# Patient Record
Sex: Female | Born: 1990 | Race: Asian | Hispanic: No | Marital: Single | Smoking: Current every day smoker
Health system: Southern US, Community
[De-identification: ages and names within clinical notes are randomized; demographics above are authoritative.]

## PROBLEM LIST (undated history)

## (undated) ENCOUNTER — Inpatient Hospital Stay (HOSPITAL_COMMUNITY): Payer: Self-pay

## (undated) DIAGNOSIS — Z8619 Personal history of other infectious and parasitic diseases: Secondary | ICD-10-CM

## (undated) DIAGNOSIS — Z87898 Personal history of other specified conditions: Secondary | ICD-10-CM

## (undated) DIAGNOSIS — R87629 Unspecified abnormal cytological findings in specimens from vagina: Secondary | ICD-10-CM

## (undated) DIAGNOSIS — I10 Essential (primary) hypertension: Secondary | ICD-10-CM

## (undated) DIAGNOSIS — F32A Depression, unspecified: Secondary | ICD-10-CM

## (undated) DIAGNOSIS — F17200 Nicotine dependence, unspecified, uncomplicated: Secondary | ICD-10-CM

## (undated) DIAGNOSIS — Z98891 History of uterine scar from previous surgery: Secondary | ICD-10-CM

## (undated) DIAGNOSIS — F329 Major depressive disorder, single episode, unspecified: Secondary | ICD-10-CM

## (undated) DIAGNOSIS — Z9889 Other specified postprocedural states: Secondary | ICD-10-CM

## (undated) DIAGNOSIS — F1291 Cannabis use, unspecified, in remission: Secondary | ICD-10-CM

## (undated) HISTORY — DX: Unspecified abnormal cytological findings in specimens from vagina: R87.629

## (undated) HISTORY — PX: BRAIN SURGERY: SHX531

---
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
## 2010-08-08 ENCOUNTER — Inpatient Hospital Stay (HOSPITAL_COMMUNITY)
Admission: AD | Admit: 2010-08-08 | Discharge: 2010-08-08 | Payer: Self-pay | Source: Home / Self Care | Admitting: Obstetrics and Gynecology

## 2010-10-14 ENCOUNTER — Inpatient Hospital Stay (HOSPITAL_COMMUNITY)
Admission: AD | Admit: 2010-10-14 | Discharge: 2010-10-14 | Disposition: A | Discharge status: Home / Self Care | Payer: Medicaid Other | Source: Ambulatory Visit | Attending: Obstetrics | Admitting: Obstetrics

## 2010-10-14 DIAGNOSIS — O4700 False labor before 37 completed weeks of gestation, unspecified trimester: Secondary | ICD-10-CM | POA: Insufficient documentation

## 2010-10-14 DIAGNOSIS — O368190 Decreased fetal movements, unspecified trimester, not applicable or unspecified: Secondary | ICD-10-CM | POA: Insufficient documentation

## 2010-10-31 ENCOUNTER — Inpatient Hospital Stay (HOSPITAL_COMMUNITY)
Admission: RE | Admit: 2010-10-31 | Discharge: 2010-11-03 | DRG: 766 | Disposition: A | Discharge status: Home / Self Care | Payer: Medicaid Other | Source: Ambulatory Visit | Attending: Obstetrics | Admitting: Obstetrics

## 2010-10-31 ENCOUNTER — Other Ambulatory Visit: Payer: Self-pay | Admitting: Obstetrics

## 2010-10-31 DIAGNOSIS — O99892 Other specified diseases and conditions complicating childbirth: Secondary | ICD-10-CM | POA: Diagnosis present

## 2010-10-31 DIAGNOSIS — Z22330 Carrier of Group B streptococcus: Secondary | ICD-10-CM

## 2010-10-31 LAB — RPR: RPR Ser Ql: NONREACTIVE

## 2010-10-31 LAB — CBC
HCT: 34.7 % — ABNORMAL LOW (ref 36.0–46.0)
RDW: 15.3 % (ref 11.5–15.5)
WBC: 13.8 10*3/uL — ABNORMAL HIGH (ref 4.0–10.5)

## 2010-11-01 LAB — CBC
HCT: 30.9 % — ABNORMAL LOW (ref 36.0–46.0)
Hemoglobin: 9.9 g/dL — ABNORMAL LOW (ref 12.0–15.0)
MCV: 76.3 fL — ABNORMAL LOW (ref 78.0–100.0)
RBC: 4.05 MIL/uL (ref 3.87–5.11)
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
## 2010-11-07 NOTE — H&P (Signed)
  NAMECHAVON, Jennifer Cohen          ACCOUNT NO.:  [id number]  MEDICAL RECORD NO.:  [id number]           PATIENT TYPE:  I  LOCATION:  9127                          [facility]ACILITY:  [facility]  PHYSICIAN:  Kathreen Cosier, M.D.DATE OF BIRTH:  1991-02-24  DATE OF ADMISSION:  10/31/2010 DATE OF DISCHARGE:                             HISTORY & PHYSICAL   The patient is a 20 year old gravida 2, para 1-0-0-1, EDC November 05, 2010, with a positive GBS, who was admitted in labor.  Cervix 4-5 cm, vertex - 2.  Membranes were ruptured artificially at 11:15 a.m., fluid was clear. The patient was contracting between 2 and 7 minutes apart and about 12:30, she started having prolonged variable decelerations, some with contractions, some without contractions.  Amnioinfusion was begun and the variables did not get better.  She was given 0.25 of terbutaline subcu in preparation for deliver by C-section.  The cervix was unchanged.  She was taken to the operating room for primary low- transverse cesarean section because of nonreassuring fetal heart rate tracing.  PHYSICAL EXAMINATION:  GENERAL:  Revealed a well-developed female, in labor. HEENT:  Negative. LUNGS:  Clear. HEART:  Regular rhythm.  No murmurs, no gallops. BREASTS:  No masses. ABDOMEN:  Term size uterus. PELVIC:  As described above. EXTREMITIES:  Negative.          ______________________________ Kathreen Cosier, M.D.     BAM/MEDQ  D:  10/31/2010  T:  11/01/2010  Job:  [id number]  Electronically Signed by Francoise Ceo M.D. on 11/06/2010 09:43:25 AM

## 2010-11-07 NOTE — Op Note (Signed)
  NAMEJANIJAH, Jennifer Cohen          ACCOUNT NO.:  [id number]  MEDICAL RECORD NO.:  [id number]           PATIENT TYPE:  I  LOCATION:  9127                          [facility]ILITY:  [facility]  PHYSICIAN:  Kathreen Cosier, M.D.DATE OF BIRTH:  [birth year]  DATE OF PROCEDURE:  10/31/2010 DATE OF DISCHARGE:                              OPERATIVE REPORT   PREOPERATIVE DIAGNOSIS:  Nonreassuring fetal heart rate tracing.  POSTOPERATIVE DIAGNOSIS:  Nonreassuring fetal heart rate tracing.  ANESTHESIA:  Spinal.  SURGEON:  Kathreen Cosier, MD  DESCRIPTION OF PROCEDURE:  The patient placed on the operating table in supine position after the spinal administered.  Abdomen prepped and draped.  Bladder emptied with Foley catheter.  Transverse suprapubic incision made, carried down to rectus fascia.  Fascia cleaned and incised to the length of the incision.  Recti muscles retracted laterally.  Peritoneum incised longitudinally.  Transverse incision made in the visceral peritoneum above the bladder.  Bladder mobilized inferiorly.  Transverse lower uterine incision made.  The patient delivered from the LOA position of a female Apgar 8 and 9, weighing 5 pounds 10 ounces.  The team was in attendance.  There were no cord issues noted to the placenta, anterior, removed manually, and sent to Pathology.  Uterine cavity cleaned with dry laps.  Uterine incision closed in 1 layer with continuous suture of #1 chromic.  Hemostasis satisfactory.  Bladder flap reattached with 2-0 chromic.  Uterus well contracted.  Tubes and ovaries normal.  The abdomen closed in layers, peritoneum with continuous suture of O chromic, fascia with continuous suture 0 Dexon, and the skin closed with skin clips.  Blood loss 400 mL. The patient tolerated the procedure well, taken to recovery room in good condition.          ______________________________ Kathreen Cosier, M.D.     BAM/MEDQ  D:  10/31/2010  T:  11/01/2010  Job:   [id number]  Electronically Signed by Francoise Ceo M.D. on 11/06/2010 09:43:28 AM

## 2010-11-12 LAB — CBC
HCT: 36.2 % (ref 36.0–46.0)
MCHC: 32.9 g/dL (ref 30.0–36.0)
MCV: 79.8 fL (ref 78.0–100.0)
RDW: 14.1 % (ref 11.5–15.5)

## 2010-11-12 LAB — URINALYSIS, ROUTINE W REFLEX MICROSCOPIC
Bilirubin Urine: NEGATIVE
Glucose, UA: NEGATIVE mg/dL
Ketones, ur: NEGATIVE mg/dL
Urobilinogen, UA: 0.2 mg/dL (ref 0.0–1.0)
pH: 8 (ref 5.0–8.0)

## 2010-11-12 LAB — URINE CULTURE
Culture  Setup Time: 201112090130
Culture: NO GROWTH

## 2010-11-12 LAB — RAPID URINE DRUG SCREEN, HOSP PERFORMED
Amphetamines: NOT DETECTED
Benzodiazepines: NOT DETECTED
Opiates: NOT DETECTED
Tetrahydrocannabinol: POSITIVE — AB

## 2010-11-12 LAB — DIFFERENTIAL
Basophils Absolute: 0 10*3/uL (ref 0.0–0.1)
Basophils Relative: 0 % (ref 0–1)
Lymphs Abs: 2.1 10*3/uL (ref 0.7–4.0)
Monocytes Relative: 5 % (ref 3–12)

## 2010-11-12 LAB — RPR: RPR Ser Ql: NONREACTIVE

## 2010-11-12 LAB — RUBELLA SCREEN: Rubella: 7 IU/mL — ABNORMAL HIGH

## 2011-09-06 ENCOUNTER — Inpatient Hospital Stay (HOSPITAL_COMMUNITY)
Admission: AD | Admit: 2011-09-06 | Discharge: 2011-09-07 | Disposition: A | Discharge status: Home / Self Care | Payer: Medicaid Other | Source: Ambulatory Visit | Attending: Family Medicine | Admitting: Family Medicine

## 2011-09-06 ENCOUNTER — Encounter (HOSPITAL_COMMUNITY): Payer: Self-pay | Admitting: *Deleted

## 2011-09-06 DIAGNOSIS — O2390 Unspecified genitourinary tract infection in pregnancy, unspecified trimester: Secondary | ICD-10-CM | POA: Insufficient documentation

## 2011-09-06 DIAGNOSIS — R109 Unspecified abdominal pain: Secondary | ICD-10-CM | POA: Insufficient documentation

## 2011-09-06 DIAGNOSIS — N760 Acute vaginitis: Secondary | ICD-10-CM | POA: Insufficient documentation

## 2011-09-06 DIAGNOSIS — B9689 Other specified bacterial agents as the cause of diseases classified elsewhere: Secondary | ICD-10-CM | POA: Insufficient documentation

## 2011-09-06 DIAGNOSIS — A499 Bacterial infection, unspecified: Secondary | ICD-10-CM | POA: Insufficient documentation

## 2011-09-06 DIAGNOSIS — Z331 Pregnant state, incidental: Secondary | ICD-10-CM

## 2011-09-06 LAB — URINALYSIS, ROUTINE W REFLEX MICROSCOPIC
Bilirubin Urine: NEGATIVE
Hgb urine dipstick: NEGATIVE
Leukocytes, UA: NEGATIVE
Nitrite: NEGATIVE
Specific Gravity, Urine: 1.01 (ref 1.005–1.030)

## 2011-09-06 NOTE — Progress Notes (Signed)
Pt states, " I woke up this morning with pain in my right lower abdomen and side. I have a white mousy discharge today."

## 2011-09-07 ENCOUNTER — Encounter (HOSPITAL_COMMUNITY): Payer: Self-pay | Admitting: Family

## 2011-09-07 LAB — WET PREP, GENITAL: Trich, Wet Prep: NONE SEEN

## 2011-09-07 MED ORDER — METRONIDAZOLE 500 MG PO TABS: 500.0000 mg | ORAL_TABLET | Freq: Two times a day (BID) | ORAL | Status: AC

## 2011-09-07 NOTE — ED Provider Notes (Signed)
History     Chief Complaint  Patient presents with  . Abdominal Pain   HPI  Pt reports lower right sided pain that started this morning.  Pain is described as a sharp, crampy pain rated 7/10.  Standing increases the pain.  Denies vaginal bleeding.  Reports positive pregnancy test at home in October.    Past Medical History  Diagnosis Date  . No pertinent past medical history     Past Surgical History  Procedure Date  . Cesarean section     Family History  Problem Relation Age of Onset  . Anesthesia problems Neg Hx   . Hypotension Neg Hx   . Malignant hyperthermia Neg Hx   . Pseudochol deficiency Neg Hx     History  Substance Use Topics  . Smoking status: Current Everyday Smoker -- 0.5 packs/day  . Smokeless tobacco: Never Used  . Alcohol Use: Yes    Allergies: No Known Allergies  No prescriptions prior to admission    Review of Systems  Gastrointestinal: Positive for abdominal pain.  All other systems reviewed and are negative.   Physical Exam   Blood pressure 117/64, pulse 98, temperature 98.4 F (36.9 C), temperature source Oral, resp. rate 20, height 5' (1.524 m), weight 82.611 kg (182 lb 2 oz), last menstrual period 05/31/2011.  Physical Exam  Constitutional: She is oriented to person, place, and time. She appears well-developed and well-nourished.  HENT:  Head: Normocephalic.  Neck: Normal range of motion. Neck supple.  Cardiovascular: Normal rate, regular rhythm and normal heart sounds.   Respiratory: Effort normal and breath sounds normal.  GI: Soft. She exhibits no mass. There is no tenderness. There is no rebound and no guarding.  Genitourinary: No bleeding around the vagina. Vaginal discharge (mucusy) found.       Cervix - closed  Neurological: She is alert and oriented to person, place, and time.  Skin: Skin is warm and dry.  FHTs 150's Fundal Height - 2 below umbilicus  MAU Course  Procedures  Results for orders placed during the  hospital encounter of 09/06/11 (from the past 24 hour(s))  URINALYSIS, ROUTINE W REFLEX MICROSCOPIC     Status: Normal   Collection Time   09/06/11 11:40 PM      Component Value Range   Color, Urine YELLOW  YELLOW    APPearance CLEAR  CLEAR    Specific Gravity, Urine 1.010  1.005 - 1.030    pH 7.5  5.0 - 8.0    Glucose, UA NEGATIVE  NEGATIVE (mg/dL)   Hgb urine dipstick NEGATIVE  NEGATIVE    Bilirubin Urine NEGATIVE  NEGATIVE    Ketones, ur NEGATIVE  NEGATIVE (mg/dL)   Protein, ur NEGATIVE  NEGATIVE (mg/dL)   Urobilinogen, UA 0.2  0.0 - 1.0 (mg/dL)   Nitrite NEGATIVE  NEGATIVE    Leukocytes, UA NEGATIVE  NEGATIVE   WET PREP, GENITAL     Status: Abnormal   Collection Time   09/07/11 12:52 AM      Component Value Range   Yeast, Wet Prep NONE SEEN  NONE SEEN    Trich, Wet Prep NONE SEEN  NONE SEEN    Clue Cells, Wet Prep FEW (*) NONE SEEN    WBC, Wet Prep HPF POC FEW (*) NONE SEEN      Assessment and Plan  Pregnancy Bacterial Vaginosis  Plan: DC home Begin prenatal care RX Flagyl  [facility] 09/07/2011, 12:39 AM

## 2011-09-07 NOTE — ED Provider Notes (Signed)
Chart reviewed and agree with management and plan.  

## 2011-09-08 LAB — POCT PREGNANCY, URINE: Preg Test, Ur: POSITIVE

## 2011-09-08 LAB — GC/CHLAMYDIA PROBE AMP, GENITAL
Chlamydia, DNA Probe: NEGATIVE
GC Probe Amp, Genital: NEGATIVE

## 2012-05-31 ENCOUNTER — Emergency Department (HOSPITAL_COMMUNITY): Payer: Self-pay

## 2012-05-31 ENCOUNTER — Encounter (HOSPITAL_COMMUNITY): Payer: Self-pay | Admitting: Emergency Medicine

## 2012-05-31 ENCOUNTER — Emergency Department (HOSPITAL_COMMUNITY)
Admission: EM | Admit: 2012-05-31 | Discharge: 2012-05-31 | Disposition: A | Discharge status: Home / Self Care | Payer: Self-pay | Attending: Emergency Medicine | Admitting: Emergency Medicine

## 2012-05-31 DIAGNOSIS — M545 Low back pain, unspecified: Secondary | ICD-10-CM | POA: Insufficient documentation

## 2012-05-31 DIAGNOSIS — F17200 Nicotine dependence, unspecified, uncomplicated: Secondary | ICD-10-CM | POA: Insufficient documentation

## 2012-05-31 DIAGNOSIS — Z87828 Personal history of other (healed) physical injury and trauma: Secondary | ICD-10-CM | POA: Insufficient documentation

## 2012-05-31 DIAGNOSIS — M549 Dorsalgia, unspecified: Secondary | ICD-10-CM

## 2012-05-31 MED ORDER — CEPHALEXIN 500 MG PO CAPS: 500.0000 mg | ORAL_CAPSULE | Freq: Four times a day (QID) | ORAL | Status: DC

## 2012-05-31 MED ORDER — OXYCODONE-ACETAMINOPHEN 5-325 MG PO TABS: 1.0000 | ORAL_TABLET | Freq: Four times a day (QID) | ORAL | Status: DC | PRN

## 2012-05-31 MED ORDER — OXYCODONE-ACETAMINOPHEN 5-325 MG PO TABS: 1.0000 | ORAL_TABLET | ORAL | Status: DC | PRN

## 2012-05-31 MED ORDER — OXYCODONE-ACETAMINOPHEN 5-325 MG PO TABS
2.0000 | ORAL_TABLET | Freq: Once | ORAL | Status: AC
  Administered 2012-05-31: 2 via ORAL
  Filled 2012-05-31: qty 2

## 2012-05-31 MED ORDER — CYCLOBENZAPRINE HCL 10 MG PO TABS: 10.0000 mg | ORAL_TABLET | Freq: Two times a day (BID) | ORAL | Status: DC | PRN

## 2012-05-31 MED ORDER — GABAPENTIN 300 MG PO CAPS
300.0000 mg | ORAL_CAPSULE | Freq: Once | ORAL | Status: AC
  Administered 2012-05-31: 300 mg via ORAL
  Filled 2012-05-31 (×2): qty 1

## 2012-05-31 MED ORDER — CYCLOBENZAPRINE HCL 10 MG PO TABS
10.0000 mg | ORAL_TABLET | Freq: Once | ORAL | Status: AC
  Administered 2012-05-31: 10 mg via ORAL
  Filled 2012-05-31: qty 1

## 2012-05-31 NOTE — ED Notes (Signed)
Pt reports having an abortion in Feb and then falling down two steps several days later.  Pt has had lower back pain since then, and left leg also hurts from her calf to lower back.  Pain is constant and is worse when she is in bed at night.  Denies numbness or tingling in extremities.

## 2012-05-31 NOTE — ED Provider Notes (Signed)
History     CSN: [id number]  Arrival date & time 05/31/12  1939   First MD Initiated Contact with Patient 05/31/12 2029      Chief Complaint  Patient presents with  . Back Pain    (Consider location/radiation/quality/duration/timing/severity/associated sxs/prior treatment) HPI Comments: Jennifer Cohen 21 y.o. female   The chief complaint is: Patient presents with:   Back Pain   The patient has medical history significant for:   Past Medical History:   No pertinent past medical history                           Patient presents with back pain since February. Patient states that the pain has been persistent since an abortion she had in February. During that same month she reports slipping on some icy steps and falling on her back. She did not seek medical care after the fall. She rates the pain 8/10 with some radiation to her left leg. Denies fever or chills. Denies NVD or abdominal pain. Denies dysuria, frequency, or urgency. Denies loss of bowel or bladder control.  Denies pregnancy, LMP beginnging of last month.      The history is provided by the patient. No language interpreter was used.    Past Medical History  Diagnosis Date  . No pertinent past medical history     Past Surgical History  Procedure Date  . Cesarean section   . Brain surgery     Family History  Problem Relation Age of Onset  . Anesthesia problems Neg Hx   . Hypotension Neg Hx   . Malignant hyperthermia Neg Hx   . Pseudochol deficiency Neg Hx     History  Substance Use Topics  . Smoking status: Current Every Day Smoker -- 0.5 packs/day  . Smokeless tobacco: Never Used  . Alcohol Use: Yes    OB History    Grav Para Term Preterm Abortions TAB SAB Ect Mult Living   2 1 1       1       Review of Systems  Constitutional: Negative for fever, chills and diaphoresis.  Gastrointestinal: Negative for nausea, vomiting, abdominal pain and diarrhea.  Musculoskeletal: Positive for back  pain.  All other systems reviewed and are negative.    Allergies  Review of patient's allergies indicates no known allergies.  Home Medications  No current outpatient prescriptions on file.  BP 114/66  Pulse 83  Temp 98.1 F (36.7 C) (Oral)  Resp 20  Wt 180 lb (81.647 kg)  SpO2 100%  LMP 05/31/2011  Breastfeeding? Unknown  Physical Exam  Nursing note and vitals reviewed. Constitutional: She appears well-developed and well-nourished. No distress.  HENT:  Head: Normocephalic and atraumatic.  Mouth/Throat: Oropharynx is clear and moist.  Eyes: Conjunctivae normal and EOM are normal. No scleral icterus.  Neck: Normal range of motion. Neck supple.  Cardiovascular: Normal rate, regular rhythm and normal heart sounds.   Pulmonary/Chest: Effort normal and breath sounds normal.  Abdominal: Soft. Bowel sounds are normal. There is no tenderness.  Musculoskeletal: Normal range of motion. She exhibits tenderness. She exhibits no edema.       Tenderness to palpation of the lumbosacral paraspinal muscles.  Neurological: She is alert.  Skin: Skin is warm and dry.    ED Course  Procedures (including critical care time)  Labs Reviewed - No data to display Dg Lumbar Spine Complete  05/31/2012  *RADIOLOGY REPORT*  Clinical Data: Low back  pain.  Larey Seat in February 2013.  LUMBAR SPINE - COMPLETE 4+ VIEW  Comparison: None.  Findings: Five non-rib bearing lumbar vertebrae.  These have normal appearances with no fractures, pars defects or subluxations.  IMPRESSION: Normal examination.   Original Report Authenticated By: Darrol Angel, M.D.      1. Chronic back pain       MDM  Patient presented with complaint of lower back s/p fall in February. Urine pregnancy test: negative. Lumbar spine complete: unremarkable. Patients history and physical seem consistent with sciatica. Patient discharged on pain medication and a muscle relaxer, and instructions to follow-up with primary care. No red  flags for epidural abscess, cauda equina, or pregnancy. Return precautions given verbally and in discharge summary.        Pixie Casino, PA-C 06/01/12 0112

## 2012-06-02 NOTE — ED Provider Notes (Signed)
Medical screening examination/treatment/procedure(s) were performed by non-physician practitioner and as supervising physician I was immediately available for consultation/collaboration.  Jones Skene, M.D.      Jones Skene, MD 06/02/12 1318

## 2013-03-14 ENCOUNTER — Inpatient Hospital Stay (HOSPITAL_COMMUNITY)
Admission: AD | Admit: 2013-03-14 | Discharge: 2013-03-15 | Disposition: A | Discharge status: Home / Self Care | Payer: Self-pay | Source: Ambulatory Visit | Attending: Obstetrics & Gynecology | Admitting: Obstetrics & Gynecology

## 2013-03-14 ENCOUNTER — Encounter (HOSPITAL_COMMUNITY): Payer: Self-pay

## 2013-03-14 ENCOUNTER — Inpatient Hospital Stay (HOSPITAL_COMMUNITY): Payer: Self-pay

## 2013-03-14 DIAGNOSIS — R109 Unspecified abdominal pain: Secondary | ICD-10-CM

## 2013-03-14 DIAGNOSIS — O9989 Other specified diseases and conditions complicating pregnancy, childbirth and the puerperium: Secondary | ICD-10-CM

## 2013-03-14 DIAGNOSIS — O99891 Other specified diseases and conditions complicating pregnancy: Secondary | ICD-10-CM | POA: Insufficient documentation

## 2013-03-14 HISTORY — DX: Depression, unspecified: F32.A

## 2013-03-14 HISTORY — DX: Major depressive disorder, single episode, unspecified: F32.9

## 2013-03-14 LAB — URINALYSIS, ROUTINE W REFLEX MICROSCOPIC
Glucose, UA: NEGATIVE mg/dL
Leukocytes, UA: NEGATIVE
pH: 6 (ref 5.0–8.0)

## 2013-03-14 LAB — CBC
Hemoglobin: 13.1 g/dL (ref 12.0–15.0)
MCHC: 33.9 g/dL (ref 30.0–36.0)
RBC: 5.14 MIL/uL — ABNORMAL HIGH (ref 3.87–5.11)
WBC: 14.1 10*3/uL — ABNORMAL HIGH (ref 4.0–10.5)

## 2013-03-14 LAB — POCT PREGNANCY, URINE: Preg Test, Ur: POSITIVE — AB

## 2013-03-14 LAB — URINE MICROSCOPIC-ADD ON

## 2013-03-14 LAB — HCG, QUANTITATIVE, PREGNANCY: hCG, Beta Chain, Quant, S: 220 m[IU]/mL — ABNORMAL HIGH (ref <5)

## 2013-03-14 MED ORDER — ACETAMINOPHEN 500 MG PO TABS
1000.0000 mg | ORAL_TABLET | Freq: Once | ORAL | Status: AC
  Administered 2013-03-14: 1000 mg via ORAL
  Filled 2013-03-14: qty 2

## 2013-03-14 NOTE — MAU Provider Note (Signed)
History     CSN: [id number]  Arrival date and time: 03/14/13 2118   First Provider Initiated Contact with Patient 03/14/13 2246      Chief Complaint  Patient presents with  . Possible Pregnancy  . Dysmenorrhea   HPI Jennifer Cohen is a 22 y.o. G4P1011 at [redacted]w[redacted]d who presents to MAU today with complaint of lower abdominal cramping since yesterday. She patient states that the pain has been off and on. She has no pain now, but pain earlier today was 5-6/10 at [facility]. She has not taken any pain medication. She has had some nausea without vomiting or diarrhea. She does endorse mild constipation. She denies vaginal bleeding, discharge, fever, dizziness, weakness or fatigue. LMP was 02/05/13    OB History   Grav Para Term Preterm Abortions TAB SAB Ect Mult Living   4 1 1  1 1    1       Past Medical History  Diagnosis Date  . No pertinent past medical history   . Depression     Past Surgical History  Procedure Laterality Date  . Cesarean section    . Brain surgery      Family History  Problem Relation Age of Onset  . Anesthesia problems Neg Hx   . Hypotension Neg Hx   . Malignant hyperthermia Neg Hx   . Pseudochol deficiency Neg Hx     History  Substance Use Topics  . Smoking status: Current Every Day Smoker -- 0.50 packs/day  . Smokeless tobacco: Never Used  . Alcohol Use: Yes    Allergies: No Known Allergies  No prescriptions prior to admission    Review of Systems  Constitutional: Negative for fever and malaise/fatigue.  Gastrointestinal: Positive for nausea and abdominal pain. Negative for vomiting, diarrhea and constipation.  Genitourinary: Negative for dysuria, urgency and frequency.       Neg - vaginal bleeding, discharge  Neurological: Negative for dizziness, loss of consciousness and weakness.   Physical Exam   Blood pressure 113/67, pulse 78, temperature 98.1 F (36.7 C), temperature source Oral, resp. rate 16, height 5' (1.524 m), weight 189 lb  (85.73 kg), last menstrual period 02/05/2013, SpO2 100.00%.  Physical Exam  Constitutional: She is oriented to person, place, and time. She appears well-developed and well-nourished. No distress.  HENT:  Head: Normocephalic and atraumatic.  Cardiovascular: Normal rate, regular rhythm and normal heart sounds.   Respiratory: Effort normal and breath sounds normal. No respiratory distress.  GI: Soft. Bowel sounds are normal. She exhibits no distension and no mass. There is tenderness (mild tenderness to palpation of the RLQ). There is no rebound and no guarding.  Genitourinary: Uterus is not enlarged and not tender. Cervix exhibits friability. Cervix exhibits no motion tenderness and no discharge. Right adnexum displays no mass and no tenderness. Left adnexum displays no mass and no tenderness. No bleeding around the vagina. Vaginal discharge (scant thin white discharge noted) found.  Neurological: She is alert and oriented to person, place, and time.  Skin: Skin is warm and dry. No erythema.  Psychiatric: She has a normal mood and affect.   Results for orders placed during the hospital encounter of 03/14/13 (from the past 24 hour(s))  URINALYSIS, ROUTINE W REFLEX MICROSCOPIC     Status: Abnormal   Collection Time    03/14/13  9:42 PM      Result Value Range   Color, Urine YELLOW  YELLOW   APPearance CLEAR  CLEAR   Specific Gravity, Urine >  1.030 (*) 1.005 - 1.030   pH 6.0  5.0 - 8.0   Glucose, UA NEGATIVE  NEGATIVE mg/dL   Hgb urine dipstick TRACE (*) NEGATIVE   Bilirubin Urine NEGATIVE  NEGATIVE   Ketones, ur NEGATIVE  NEGATIVE mg/dL   Protein, ur NEGATIVE  NEGATIVE mg/dL   Urobilinogen, UA 0.2  0.0 - 1.0 mg/dL   Nitrite NEGATIVE  NEGATIVE   Leukocytes, UA NEGATIVE  NEGATIVE  URINE MICROSCOPIC-ADD ON     Status: Abnormal   Collection Time    03/14/13  9:42 PM      Result Value Range   Squamous Epithelial / LPF FEW (*) RARE   WBC, UA 0-2  <3 WBC/hpf   RBC / HPF 0-2  <3 RBC/hpf    Bacteria, UA FEW (*) RARE   Urine-Other MUCOUS PRESENT    POCT PREGNANCY, URINE     Status: Abnormal   Collection Time    03/14/13 10:06 PM      Result Value Range   Preg Test, Ur POSITIVE (*) NEGATIVE  WET PREP, GENITAL     Status: Abnormal   Collection Time    03/14/13 10:55 PM      Result Value Range   Yeast Wet Prep HPF POC NONE SEEN  NONE SEEN   Trich, Wet Prep NONE SEEN  NONE SEEN   Clue Cells Wet Prep HPF POC NONE SEEN  NONE SEEN   WBC, Wet Prep HPF POC FEW (*) NONE SEEN  CBC     Status: Abnormal   Collection Time    03/14/13 11:05 PM      Result Value Range   WBC 14.1 (*) 4.0 - 10.5 K/uL   RBC 5.14 (*) 3.87 - 5.11 MIL/uL   Hemoglobin 13.1  12.0 - 15.0 g/dL   HCT 16.1  09.6 - 04.5 %   MCV 75.3 (*) 78.0 - 100.0 fL   MCH 25.5 (*) 26.0 - 34.0 pg   MCHC 33.9  30.0 - 36.0 g/dL   RDW 40.9  81.1 - 91.4 %   Platelets 382  150 - 400 K/uL  HCG, QUANTITATIVE, PREGNANCY     Status: Abnormal   Collection Time    03/14/13 11:05 PM      Result Value Range   hCG, Beta Chain, Quant, S 220 (*) <5 mIU/mL   US Ob Comp Less 14 Wks  03/15/2013   *RADIOLOGY REPORT*  Clinical Data: Pelvic pain and cramping.  OBSTETRIC <14 WK [place] AND TRANSVAGINAL OB US  Technique:  Both transabdominal and transvaginal ultrasound examinations were performed for complete evaluation of the gestation as well as the maternal uterus, adnexal regions, and pelvic cul-de-sac.  Transvaginal technique was performed to assess early pregnancy.  Comparison:  None.  Intrauterine gestational sac:  None seen. Yolk sac: N/A Embryo: N/A  Maternal uterus/adnexae: The uterus is unremarkable in appearance.  A small Nabothian cyst is noted at the cervix.  There is a small centrally hypoechoic focus noted arising at the right ovary; its appearance is more suggestive of a corpus luteum, and it does not appear to separate from the right ovary with pressure.  The right ovary is otherwise unremarkable; it measures 3.3 x 2.8 x 3.0 cm.  The left  ovary is within normal limits, measuring 2.4 x 2.2 x 1.6 cm.  No suspicious left adnexal masses are seen.  A small amount of free fluid is noted in the pelvic cul-de-sac.  IMPRESSION: No intrauterine gestational sac yet seen.  A small centrally hypoechoic focus of the right ovary has an appearance more suggestive of a corpus luteum.  Given the patient's quantitative beta HCG level of 220, an ectopic pregnancy would not yet be visible.  Would perform follow-up pelvic ultrasound in 1-2 weeks, to assess for an intrauterine gestational sac.   Original Report Authenticated By: Tonia Ghent, M.D.   US Ob Transvaginal  03/15/2013   *RADIOLOGY REPORT*  Clinical Data: Pelvic pain and cramping.  OBSTETRIC <14 WK [place] AND TRANSVAGINAL OB US  Technique:  Both transabdominal and transvaginal ultrasound examinations were performed for complete evaluation of the gestation as well as the maternal uterus, adnexal regions, and pelvic cul-de-sac.  Transvaginal technique was performed to assess early pregnancy.  Comparison:  None.  Intrauterine gestational sac:  None seen. Yolk sac: N/A Embryo: N/A  Maternal uterus/adnexae: The uterus is unremarkable in appearance.  A small Nabothian cyst is noted at the cervix.  There is a small centrally hypoechoic focus noted arising at the right ovary; its appearance is more suggestive of a corpus luteum, and it does not appear to separate from the right ovary with pressure.  The right ovary is otherwise unremarkable; it measures 3.3 x 2.8 x 3.0 cm.  The left ovary is within normal limits, measuring 2.4 x 2.2 x 1.6 cm.  No suspicious left adnexal masses are seen.  A small amount of free fluid is noted in the pelvic cul-de-sac.  IMPRESSION: No intrauterine gestational sac yet seen.  A small centrally hypoechoic focus of the right ovary has an appearance more suggestive of a corpus luteum.  Given the patient's quantitative beta HCG level of 220, an ectopic pregnancy would not yet be visible.  Would  perform follow-up pelvic ultrasound in 1-2 weeks, to assess for an intrauterine gestational sac.   Original Report Authenticated By: Tonia Ghent, M.D.      MAU Course  Procedures None  MDM +UPT UA, Wet prep, GC/Chlamydia, CBC, quant hCG and [place] today  Assessment and Plan  A: Abdominal pain in pregnancy, first trimester  P: Discharge home Ectopic precautions reviewed Patient advised to take Tylenol PRN for pain Patient advised to return to MAU in ~ 48 hours for repeat quant hCG Patient may return sooner as needed or if symptoms were to change or worsen   Freddi Starr, PA-C  03/15/2013, 1:59 AM

## 2013-03-14 NOTE — MAU Note (Signed)
Pt reports off/on cramping today. Denies dysuria. Denies bleeding. LMP 02/05/2013

## 2013-03-15 LAB — GC/CHLAMYDIA PROBE AMP
CT Probe RNA: NEGATIVE
GC Probe RNA: NEGATIVE

## 2013-03-20 ENCOUNTER — Encounter (HOSPITAL_COMMUNITY): Payer: Self-pay | Admitting: *Deleted

## 2013-03-20 ENCOUNTER — Inpatient Hospital Stay (HOSPITAL_COMMUNITY): Payer: Self-pay

## 2013-03-20 ENCOUNTER — Inpatient Hospital Stay (HOSPITAL_COMMUNITY)
Admission: AD | Admit: 2013-03-20 | Discharge: 2013-03-20 | DRG: 781 | Disposition: A | Discharge status: Home / Self Care | Payer: MEDICAID | Source: Ambulatory Visit | Attending: Obstetrics & Gynecology | Admitting: Obstetrics & Gynecology

## 2013-03-20 DIAGNOSIS — N831 Corpus luteum cyst of ovary, unspecified side: Secondary | ICD-10-CM | POA: Diagnosis present

## 2013-03-20 DIAGNOSIS — N72 Inflammatory disease of cervix uteri: Secondary | ICD-10-CM | POA: Diagnosis present

## 2013-03-20 DIAGNOSIS — O2390 Unspecified genitourinary tract infection in pregnancy, unspecified trimester: Secondary | ICD-10-CM | POA: Diagnosis present

## 2013-03-20 DIAGNOSIS — O26899 Other specified pregnancy related conditions, unspecified trimester: Secondary | ICD-10-CM

## 2013-03-20 DIAGNOSIS — O99891 Other specified diseases and conditions complicating pregnancy: Principal | ICD-10-CM | POA: Diagnosis present

## 2013-03-20 DIAGNOSIS — R109 Unspecified abdominal pain: Secondary | ICD-10-CM | POA: Diagnosis present

## 2013-03-20 DIAGNOSIS — O34599 Maternal care for other abnormalities of gravid uterus, unspecified trimester: Secondary | ICD-10-CM | POA: Diagnosis present

## 2013-03-20 LAB — URINALYSIS, ROUTINE W REFLEX MICROSCOPIC
Ketones, ur: 15 mg/dL — AB
Leukocytes, UA: NEGATIVE
Nitrite: NEGATIVE
Specific Gravity, Urine: 1.025 (ref 1.005–1.030)
pH: 6.5 (ref 5.0–8.0)

## 2013-03-20 LAB — CBC
Hemoglobin: 12.7 g/dL (ref 12.0–15.0)
MCHC: 33.8 g/dL (ref 30.0–36.0)
WBC: 8.8 10*3/uL (ref 4.0–10.5)

## 2013-03-20 LAB — HCG, QUANTITATIVE, PREGNANCY: hCG, Beta Chain, Quant, S: 1772 m[IU]/mL — ABNORMAL HIGH (ref <5)

## 2013-03-20 MED ORDER — PRENATAL VIT W/ FE BISG-FA 27-1 MG PO TABS: 1.0000 | ORAL_TABLET | ORAL | Status: DC

## 2013-03-20 NOTE — MAU Provider Note (Signed)
History     CSN: [id number]  Arrival date and time: 03/20/13 4098   First Provider Initiated Contact with Patient 03/20/13 1844      Chief Complaint  Patient presents with  . Abdominal Pain   HPI  Pt is [redacted]w[redacted]d pregnant initially seen on 7/16 with abd pain and cramping.  Her HCG was 220 and ultrasound showed no IUGS-right CLC.  Pt's cramping and pain has gotten worse since 5 pm this afternoon.  Pt denies vaginal bleeding or spotting, UTI symptoms, constipation or diarrhea. RN note; Pt presents to MAU stating that she had an appointment for an ultrasound on Wednesday July the 16th and was not able to make her appointment. Pt states today she started having abdominal cramping, denies any bleeding.  Past Medical History  Diagnosis Date  . No pertinent past medical history   . Depression     Past Surgical History  Procedure Laterality Date  . Cesarean section    . Brain surgery      Family History  Problem Relation Age of Onset  . Anesthesia problems Neg Hx   . Hypotension Neg Hx   . Malignant hyperthermia Neg Hx   . Pseudochol deficiency Neg Hx     History  Substance Use Topics  . Smoking status: Current Every Day Smoker -- 0.50 packs/day  . Smokeless tobacco: Never Used  . Alcohol Use: Yes    Allergies: No Known Allergies  Prescriptions prior to admission  Medication Sig Dispense Refill  . cyclobenzaprine (FLEXERIL) 10 MG tablet Take 1 tablet (10 mg total) by mouth 2 (two) times daily as needed for muscle spasms.  20 tablet  0  . oxyCODONE-acetaminophen (PERCOCET/ROXICET) 5-325 MG per tablet Take 1 tablet by mouth every 6 (six) hours as needed for pain.  15 tablet  0    Review of Systems  Constitutional: Negative for fever and chills.  Gastrointestinal: Positive for abdominal pain. Negative for diarrhea and constipation.  Genitourinary: Negative for dysuria and urgency.   Physical Exam   Blood pressure 120/41, pulse 99, temperature 96.7 F (35.9 C),  temperature source Oral, resp. rate 16, last menstrual period 02/05/2013.  Physical Exam  Nursing note and vitals reviewed. Constitutional: She is oriented to person, place, and time. She appears well-developed and well-nourished.  HENT:  Head: Normocephalic.  Eyes: Pupils are equal, round, and reactive to light.  Neck: Normal range of motion. Neck supple.  Cardiovascular: Normal rate.   Respiratory: Effort normal.  GI: Soft.  Musculoskeletal: Normal range of motion.  Neurological: She is alert and oriented to person, place, and time.  Skin: Skin is warm and dry.  Psychiatric: She has a normal mood and affect.    MAU Course  Procedures  Care handed over to Deeann Cree, CNM   Assessment and Plan    LINEBERRY,SUSAN 03/20/2013, 6:45 PM     SUBJECTIVE: Mild menstrual-like cramping intermittently over the last week or 2. OBJECTIVE:  Results for orders placed during the hospital encounter of 03/20/13 (from the past 24 hour(s))  URINALYSIS, ROUTINE W REFLEX MICROSCOPIC     Status: Abnormal   Collection Time    03/20/13  6:15 PM      Result Value Range   Color, Urine YELLOW  YELLOW   APPearance CLEAR  CLEAR   Specific Gravity, Urine 1.025  1.005 - 1.030   pH 6.5  5.0 - 8.0   Glucose, UA NEGATIVE  NEGATIVE mg/dL   Hgb urine dipstick NEGATIVE  NEGATIVE  Bilirubin Urine NEGATIVE  NEGATIVE   Ketones, ur 15 (*) NEGATIVE mg/dL   Protein, ur NEGATIVE  NEGATIVE mg/dL   Urobilinogen, UA 0.2  0.0 - 1.0 mg/dL   Nitrite NEGATIVE  NEGATIVE   Leukocytes, UA NEGATIVE  NEGATIVE  CBC     Status: Abnormal   Collection Time    03/20/13  6:45 PM      Result Value Range   WBC 8.8  4.0 - 10.5 K/uL   RBC 4.97  3.87 - 5.11 MIL/uL   Hemoglobin 12.7  12.0 - 15.0 g/dL   HCT 81.1  91.4 - 78.2 %   MCV 75.7 (*) 78.0 - 100.0 fL   MCH 25.6 (*) 26.0 - 34.0 pg   MCHC 33.8  30.0 - 36.0 g/dL   RDW 95.6  21.3 - 08.6 %   Platelets 343  150 - 400 K/uL  HCG, QUANTITATIVE, PREGNANCY     Status:  Abnormal   Collection Time    03/20/13  6:45 PM      Result Value Range   hCG, Beta Chain, Quant, S 1772 (*) <5 mIU/mL   *RADIOLOGY REPORT*  Clinical Data: Pelvic pain and cramping.  OBSTETRIC <14 WK [place] AND TRANSVAGINAL OB US  Technique: Both transabdominal and transvaginal ultrasound  examinations were performed for complete evaluation of the  gestation as well as the maternal uterus, adnexal regions, and  pelvic cul-de-sac. Transvaginal technique was performed to assess  early pregnancy.  Comparison: None.  Intrauterine gestational sac: None seen.  Yolk sac: N/A  Embryo: N/A  Maternal uterus/adnexae:  The uterus is unremarkable in appearance. A small Nabothian cyst  is noted at the cervix.  There is a small centrally hypoechoic focus noted arising at the  right ovary; its appearance is more suggestive of a corpus luteum,  and it does not appear to separate from the right ovary with  pressure. The right ovary is otherwise unremarkable; it measures  3.3 x 2.8 x 3.0 cm.  The left ovary is within normal limits, measuring 2.4 x 2.2 x 1.6  cm. No suspicious left adnexal masses are seen.  A small amount of free fluid is noted in the pelvic cul-de-sac.  IMPRESSION:  No intrauterine gestational sac yet seen. A small centrally  hypoechoic focus of the right ovary has an appearance more  suggestive of a corpus luteum. Given the patient's quantitative  beta HCG level of 220, an ectopic pregnancy would not yet be  visible. Would perform follow-up pelvic ultrasound in 1-2 weeks,  to assess for an intrauterine gestational sac.  Original Report Authenticated By: Tonia Ghent, M.D.   ASSESSMENT: Abdominal pain in early pregnancy Appropriate rise in quant; indeterminate viability  PLAN: Home with ectopic precautions Followup outpatient ultrasound in 2 weeks Rx prenatal vitamins Pregnancy verification letter and eligibility information given

## 2013-03-20 NOTE — MAU Note (Signed)
Pt presents to MAU stating that she had an appointment for an ultrasound on Wednesday July the 16th and was not able to make her appointment. Pt states today she started having abdominal cramping, denies any bleeding.

## 2013-03-20 NOTE — MAU Provider Note (Signed)

## 2013-03-28 ENCOUNTER — Ambulatory Visit (HOSPITAL_COMMUNITY): Payer: Medicaid Other

## 2013-04-04 ENCOUNTER — Ambulatory Visit (HOSPITAL_COMMUNITY): Admission: RE | Admit: 2013-04-04 | Payer: Self-pay | Source: Ambulatory Visit

## 2013-04-15 ENCOUNTER — Inpatient Hospital Stay (HOSPITAL_COMMUNITY): Payer: Self-pay

## 2013-04-15 ENCOUNTER — Inpatient Hospital Stay (HOSPITAL_COMMUNITY)
Admission: AD | Admit: 2013-04-15 | Discharge: 2013-04-16 | Disposition: A | Discharge status: Home / Self Care | Payer: Self-pay | Source: Ambulatory Visit | Attending: Obstetrics & Gynecology | Admitting: Obstetrics & Gynecology

## 2013-04-15 ENCOUNTER — Encounter (HOSPITAL_COMMUNITY): Payer: Self-pay | Admitting: *Deleted

## 2013-04-15 DIAGNOSIS — M549 Dorsalgia, unspecified: Secondary | ICD-10-CM | POA: Insufficient documentation

## 2013-04-15 DIAGNOSIS — O021 Missed abortion: Secondary | ICD-10-CM | POA: Insufficient documentation

## 2013-04-15 DIAGNOSIS — R109 Unspecified abdominal pain: Secondary | ICD-10-CM | POA: Insufficient documentation

## 2013-04-15 LAB — URINALYSIS, ROUTINE W REFLEX MICROSCOPIC
Bilirubin Urine: NEGATIVE
Nitrite: NEGATIVE
Protein, ur: NEGATIVE mg/dL
Urobilinogen, UA: 1 mg/dL (ref 0.0–1.0)

## 2013-04-15 LAB — CBC
MCH: 25.8 pg — ABNORMAL LOW (ref 26.0–34.0)
MCV: 75.4 fL — ABNORMAL LOW (ref 78.0–100.0)
Platelets: 391 10*3/uL (ref 150–400)
RBC: 4.72 MIL/uL (ref 3.87–5.11)
RDW: 14.1 % (ref 11.5–15.5)
WBC: 12.8 10*3/uL — ABNORMAL HIGH (ref 4.0–10.5)

## 2013-04-15 LAB — HCG, QUANTITATIVE, PREGNANCY: hCG, Beta Chain, Quant, S: 4240 m[IU]/mL — ABNORMAL HIGH (ref <5)

## 2013-04-15 NOTE — MAU Note (Signed)
Lower back pain for 2-3 wks. Abdominal cramping since last month. No bleeding or d/c

## 2013-04-15 NOTE — MAU Provider Note (Signed)
History     CSN: [id number]  Arrival date and time: 04/15/13 2013   None     Chief Complaint  Patient presents with  . Back Pain  . Abdominal Cramping   HPI  Jennifer Cohen is a 22 y.o. G3P1011 at [redacted]w[redacted]d who presents today with cramping. She denies any bleeding at this time.   Past Medical History  Diagnosis Date  . No pertinent past medical history   . Depression     Past Surgical History  Procedure Laterality Date  . Cesarean section    . Brain surgery      Family History  Problem Relation Age of Onset  . Anesthesia problems Neg Hx   . Hypotension Neg Hx   . Malignant hyperthermia Neg Hx   . Pseudochol deficiency Neg Hx   . Alcohol abuse Neg Hx     History  Substance Use Topics  . Smoking status: Current Every Day Smoker -- 0.50 packs/day  . Smokeless tobacco: Never Used  . Alcohol Use: No    Allergies: No Known Allergies  Prescriptions prior to admission  Medication Sig Dispense Refill  . acetaminophen (TYLENOL) 500 MG tablet Take 1,000 mg by mouth every 6 (six) hours as needed for pain.      . Prenatal Vit w/ Fe Bisg-FA 27-1 MG TABS Take 1 tablet by mouth 1 day or 1 dose.  30 each  0    ROS Physical Exam   Blood pressure 122/65, pulse 108, temperature 98 F (36.7 C), resp. rate 18, height 5' (1.524 m), weight 86.546 kg (190 lb 12.8 oz), last menstrual period 02/05/2013.  Physical Exam  MAU Course  Procedures  Results for orders placed during the hospital encounter of 04/15/13 (from the past 24 hour(s))  URINALYSIS, ROUTINE W REFLEX MICROSCOPIC     Status: Abnormal   Collection Time    04/15/13  8:26 PM      Result Value Range   Color, Urine YELLOW  YELLOW   APPearance CLEAR  CLEAR   Specific Gravity, Urine 1.025  1.005 - 1.030   pH 6.5  5.0 - 8.0   Glucose, UA NEGATIVE  NEGATIVE mg/dL   Hgb urine dipstick TRACE (*) NEGATIVE   Bilirubin Urine NEGATIVE  NEGATIVE   Ketones, ur 15 (*) NEGATIVE mg/dL   Protein, ur NEGATIVE  NEGATIVE  mg/dL   Urobilinogen, UA 1.0  0.0 - 1.0 mg/dL   Nitrite NEGATIVE  NEGATIVE   Leukocytes, UA NEGATIVE  NEGATIVE  URINE MICROSCOPIC-ADD ON     Status: Abnormal   Collection Time    04/15/13  8:26 PM      Result Value Range   Squamous Epithelial / LPF RARE  RARE   WBC, UA 0-2  <3 WBC/hpf   RBC / HPF 0-2  <3 RBC/hpf   Bacteria, UA FEW (*) RARE  CBC     Status: Abnormal   Collection Time    04/15/13  8:41 PM      Result Value Range   WBC 12.8 (*) 4.0 - 10.5 K/uL   RBC 4.72  3.87 - 5.11 MIL/uL   Hemoglobin 12.2  12.0 - 15.0 g/dL   HCT 40.9 (*) 81.1 - 91.4 %   MCV 75.4 (*) 78.0 - 100.0 fL   MCH 25.8 (*) 26.0 - 34.0 pg   MCHC 34.3  30.0 - 36.0 g/dL   RDW 78.2  95.6 - 21.3 %   Platelets 391  150 - 400 K/uL  HCG,  QUANTITATIVE, PREGNANCY     Status: Abnormal   Collection Time    04/15/13  8:41 PM      Result Value Range   hCG, Beta Chain, Quant, S 4240 (*) <5 mIU/mL   US Ob Transvaginal  04/15/2013   CLINICAL DATA:  Pelvic pain and cramping. Pregnancy with inconclusive viability.  EXAM: TRANSVAGINAL OB ULTRASOUND  TECHNIQUE: Transvaginal ultrasound was performed for complete evaluation of the gestation as well as the maternal uterus, adnexal regions, and pelvic cul-de-sac.  COMPARISON:  03/20/13  FINDINGS: Intrauterine gestational sac: Visualized/ irregular in shape.  Yolk sac:  Not visualized  Embryo:  Not visualized  MSD: 14  mm   6 w   1  d  Maternal uterus/adnexae: Both ovaries are normal in appearance. No mass or free fluid identified.  IMPRESSION: Findings meet definitive criteria for failed pregnancy. This recommendation follows SRU consensus guidelines: Diagnostic Criteria for Nonviable Pregnancy Early in the First Trimester. [facility] [id number].   Electronically Signed   By: Myles Rosenthal   On: 04/15/2013 21:58        Early Intrauterine Pregnancy Failure  X  Documented intrauterine pregnancy failure less than or equal to [redacted] weeks gestation  X  No serious current  illness  x  Baseline Hgb greater than or equal to 10g/dl  x  Patient has easily accessible transportation to the hospital  x  Clear preference  x  Practitioner/physician deems patient reliable  x  Counseling by practitioner or physician  x  Patient education by RN  x  Consent form signed  NA Rho-Gam given by RN if indicated  ___ Medication dispensed   x   Cytotec 800 mcg  x   Intravaginally by patient at home         __   Intravaginally by RN in MAU        __   Rectally by patient at home        __   Rectally by RN in MAU  x Ibuprofen 600 mg 1 tablet by mouth every 6 hours as needed #30  x  Hydrocodone/acetaminophen 5/325 mg by mouth every 4 to 6 hours as needed  x  Phenergan 12.5 mg by mouth every 4 hours as needed for nausea   Assessment and Plan   1. Abortion, missed    Patient desires cytotec at home Instructions given for use Bleeding precautions reviewed Knows when to return to MAU Otherwise FU in GYN clinic in 2 weeks.   Tawnya Crook 04/15/2013, 10:37 PM

## 2013-04-16 DIAGNOSIS — O021 Missed abortion: Secondary | ICD-10-CM

## 2013-04-16 MED ORDER — MISOPROSTOL 200 MCG PO TABS: 800.0000 ug | ORAL_TABLET | Freq: Once | ORAL | Status: DC

## 2013-04-16 MED ORDER — PROMETHAZINE HCL 12.5 MG PO TABS: 12.5000 mg | ORAL_TABLET | ORAL | Status: DC | PRN

## 2013-04-16 MED ORDER — HYDROCODONE-ACETAMINOPHEN 5-325 MG PO TABS: 1.0000 | ORAL_TABLET | Freq: Four times a day (QID) | ORAL | Status: DC | PRN

## 2013-04-16 MED ORDER — IBUPROFEN 200 MG PO TABS: 600.0000 mg | ORAL_TABLET | Freq: Four times a day (QID) | ORAL | Status: DC | PRN

## 2013-04-16 NOTE — MAU Provider Note (Signed)
Attestation of Attending Supervision of Advanced Practitioner (CNM/NP): Evaluation and management procedures were performed by the Advanced Practitioner under my supervision and collaboration.  I have reviewed the Advanced Practitioner's note and chart, and I agree with the management and plan.  HARRAWAY-SMITH, [employer] 3:19 AM

## 2013-04-16 NOTE — Progress Notes (Signed)
Written and verbal d/c instructions given and understanding voiced. Zorita Pang CNM in earlier to discuss d/c plan. Consent signed for cytotec use by patient at home which pt prefers to do.

## 2013-04-28 ENCOUNTER — Encounter: Payer: Medicaid Other | Admitting: Advanced Practice Midwife

## 2013-06-25 ENCOUNTER — Emergency Department (HOSPITAL_COMMUNITY)
Admission: EM | Admit: 2013-06-25 | Discharge: 2013-06-25 | Disposition: A | Discharge status: Home / Self Care | Payer: No Typology Code available for payment source | Attending: Emergency Medicine | Admitting: Emergency Medicine

## 2013-06-25 ENCOUNTER — Encounter (HOSPITAL_COMMUNITY): Payer: Self-pay | Admitting: Emergency Medicine

## 2013-06-25 ENCOUNTER — Emergency Department (HOSPITAL_COMMUNITY): Payer: No Typology Code available for payment source

## 2013-06-25 DIAGNOSIS — F17200 Nicotine dependence, unspecified, uncomplicated: Secondary | ICD-10-CM | POA: Insufficient documentation

## 2013-06-25 DIAGNOSIS — Z3202 Encounter for pregnancy test, result negative: Secondary | ICD-10-CM | POA: Insufficient documentation

## 2013-06-25 DIAGNOSIS — S8010XA Contusion of unspecified lower leg, initial encounter: Secondary | ICD-10-CM | POA: Insufficient documentation

## 2013-06-25 DIAGNOSIS — Z791 Long term (current) use of non-steroidal anti-inflammatories (NSAID): Secondary | ICD-10-CM | POA: Insufficient documentation

## 2013-06-25 DIAGNOSIS — Z8659 Personal history of other mental and behavioral disorders: Secondary | ICD-10-CM | POA: Insufficient documentation

## 2013-06-25 DIAGNOSIS — T148XXA Other injury of unspecified body region, initial encounter: Secondary | ICD-10-CM

## 2013-06-25 DIAGNOSIS — Y9389 Activity, other specified: Secondary | ICD-10-CM | POA: Insufficient documentation

## 2013-06-25 DIAGNOSIS — Y92410 Unspecified street and highway as the place of occurrence of the external cause: Secondary | ICD-10-CM | POA: Insufficient documentation

## 2013-06-25 LAB — RAPID URINE DRUG SCREEN, HOSP PERFORMED
Amphetamines: NOT DETECTED
Benzodiazepines: NOT DETECTED
Cocaine: NOT DETECTED
Opiates: NOT DETECTED
Tetrahydrocannabinol: POSITIVE — AB

## 2013-06-25 LAB — PREGNANCY, URINE: Preg Test, Ur: NEGATIVE

## 2013-06-25 MED ORDER — IBUPROFEN 800 MG PO TABS
800.0000 mg | ORAL_TABLET | Freq: Once | ORAL | Status: AC
  Administered 2013-06-25: 800 mg via ORAL
  Filled 2013-06-25: qty 1

## 2013-06-25 MED ORDER — IBUPROFEN 800 MG PO TABS: 800.0000 mg | ORAL_TABLET | Freq: Three times a day (TID) | ORAL | Status: DC

## 2013-06-25 MED ORDER — ONDANSETRON HCL 4 MG/2ML IJ SOLN
INTRAMUSCULAR | Status: AC
  Filled 2013-06-25: qty 2
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
## 2013-06-25 NOTE — ED Notes (Signed)
Pt is awake, alert.  Pt's respirations are equal and non labored. 

## 2013-06-26 NOTE — ED Provider Notes (Signed)
  Medical screening examination/treatment/procedure(s) were performed by non-physician practitioner and as supervising physician I was immediately available for consultation/collaboration.      Gerhard Munch, MD 06/26/13 [phone]

## 2013-08-20 ENCOUNTER — Encounter (HOSPITAL_COMMUNITY): Payer: Self-pay

## 2013-08-20 ENCOUNTER — Inpatient Hospital Stay (HOSPITAL_COMMUNITY): Payer: Self-pay

## 2013-08-20 ENCOUNTER — Inpatient Hospital Stay (HOSPITAL_COMMUNITY)
Admission: AD | Admit: 2013-08-20 | Discharge: 2013-08-20 | Disposition: A | Discharge status: Home / Self Care | Payer: Medicaid Other | Source: Ambulatory Visit | Attending: Family Medicine | Admitting: Family Medicine

## 2013-08-20 DIAGNOSIS — O99330 Smoking (tobacco) complicating pregnancy, unspecified trimester: Secondary | ICD-10-CM | POA: Insufficient documentation

## 2013-08-20 DIAGNOSIS — O3680X1 Pregnancy with inconclusive fetal viability, fetus 1: Secondary | ICD-10-CM

## 2013-08-20 DIAGNOSIS — R109 Unspecified abdominal pain: Secondary | ICD-10-CM | POA: Insufficient documentation

## 2013-08-20 DIAGNOSIS — O99891 Other specified diseases and conditions complicating pregnancy: Secondary | ICD-10-CM | POA: Insufficient documentation

## 2013-08-20 DIAGNOSIS — O2690 Pregnancy related conditions, unspecified, unspecified trimester: Secondary | ICD-10-CM

## 2013-08-20 DIAGNOSIS — Z3200 Encounter for pregnancy test, result unknown: Secondary | ICD-10-CM

## 2013-08-20 LAB — HCG, QUANTITATIVE, PREGNANCY: hCG, Beta Chain, Quant, S: 11699 m[IU]/mL — ABNORMAL HIGH (ref <5)

## 2013-08-20 LAB — URINE MICROSCOPIC-ADD ON

## 2013-08-20 LAB — URINALYSIS, ROUTINE W REFLEX MICROSCOPIC
Glucose, UA: NEGATIVE mg/dL
Ketones, ur: >80 mg/dL — AB
Protein, ur: NEGATIVE mg/dL

## 2013-08-20 MED ORDER — PRENATAL VITAMINS 0.8 MG PO TABS: 1.0000 | ORAL_TABLET | Freq: Every day | ORAL | Status: DC

## 2013-08-20 NOTE — MAU Provider Note (Signed)
History     CSN: [id number]  Arrival date and time: 08/20/13 1422   None     Chief Complaint  Patient presents with  . Abdominal Pain   HPI Jennifer Cohen is a 22 y.o. G4P1021 at [redacted]w[redacted]d by LMP presents to the MAU for evaluation of pregnancy. Pt had last period end of October. Pt reports painful cramping over the last week worse in the Right lower quadrant. Pt states that she also has had a +home pregnancy test. Pt reporst no vaginal bleeding, no LOF, No fevers, chills, SOB, n/V, d/c. Pt has a hx of similar pegnancy loss with her last pregnancy this spring.    OB History   Grav Para Term Preterm Abortions TAB SAB Ect Mult Living   4 1 1  2 1 1   1       Past Medical History  Diagnosis Date  . No pertinent past medical history   . Depression     Past Surgical History  Procedure Laterality Date  . Cesarean section    . Brain surgery      Family History  Problem Relation Age of Onset  . Anesthesia problems Neg Hx   . Hypotension Neg Hx   . Malignant hyperthermia Neg Hx   . Pseudochol deficiency Neg Hx   . Alcohol abuse Neg Hx     History  Substance Use Topics  . Smoking status: Current Every Day Smoker -- 0.50 packs/day  . Smokeless tobacco: Never Used  . Alcohol Use: No    Allergies: No Known Allergies  No prescriptions prior to admission    ROS as above Physical Exam   Blood pressure 131/71, pulse 105, temperature 97.9 F (36.6 C), temperature source Oral, resp. rate 18, last menstrual period 06/26/2013.  Physical Exam VSS NAD RRR no mgt Moderate tenderness to palpation in RLQ. Neg Percussion.  No c/c/e  Results for Jennifer Cohen (MRN [id number]) as of 08/20/2013 16:24  Ref. Range 08/20/2013 15:00 08/20/2013 15:24 08/20/2013 15:25 08/20/2013 15:32  hCG, Beta Chain, Quant, S Latest Range: <5 mIU/mL  11699 (H)    Preg Test, Ur Latest Range: NEGATIVE     POSITIVE (A)  ABO/RH(D) No range found   A POS   Color, Urine Latest Range: YELLOW   YELLOW     APPearance Latest Range: CLEAR  CLEAR     Specific Gravity, Urine Latest Range: 1.005-1.030  >1.030 (H)     pH Latest Range: 5.0-8.0  6.0     Glucose Latest Range: NEGATIVE mg/dL NEGATIVE     Bilirubin Urine Latest Range: NEGATIVE  SMALL (A)     Ketones, ur Latest Range: NEGATIVE mg/dL >78 (A)     Protein Latest Range: NEGATIVE mg/dL NEGATIVE     Urobilinogen, UA Latest Range: 0.0-1.0 mg/dL 0.2     Nitrite Latest Range: NEGATIVE  NEGATIVE     Leukocytes, UA Latest Range: NEGATIVE  NEGATIVE     Hgb urine dipstick Latest Range: NEGATIVE  SMALL (A)     Urine-Other No range found MUCOUS PRESENT     RBC / HPF Latest Range: <3 RBC/hpf 0-2     Squamous Epithelial / LPF Latest Range: RARE  FEW (A)     Bacteria, UA Latest Range: RARE  RARE      Preliminary [place] report; GA by LMP is 7+6, by [place] 6+1 MAU Course  Procedures  Assessment and Plan  Jennifer Cohen is a 21 y.o. G9F6213 at 6+1 with no evidence of ectopic.  Will discharge home with letter confirmation. Pt may take tylenol PRN for pain. Rx for PNV provided and recommended to establish care with preferred OB. Pt discharged with strict return precautions for worsening sx. Pain, bleeding ect.  Tawana Scale 08/20/2013, 3:32 PM

## 2013-08-20 NOTE — MAU Note (Signed)
Pt presents complaining of cramping a few times a day since last week and one cramp yesterday that is worse with movement. Denies vaginal bleeding and discharge

## 2013-08-21 NOTE — MAU Provider Note (Signed)
Chart reviewed and agree with management and plan.  

## 2013-09-01 NOTE — L&D Delivery Note (Signed)
Delivery Note At 6:24 PM a viable female was delivered via VBAC, Spontaneous ( Compound Presentation: Right Occiput Anterior with the posterior arm).  APGAR: 8, 9t .   Placenta status: Intact, Spontaneous.  Cord: 3 vessels with the following complications: None.  Loose nuchal cord x 1 readily reduced.  Anesthesia: None  Episiotomy: None Lacerations: Labial, sulcal Suture Repair: 2.0 3.0 vicryl rapide Est. Blood Loss (mL):  300 ml  Mom to postpartum.  Baby to Couplet care / Skin to Skin.  JACKSON-MOORE,Una Yeomans A 04/07/2014, 6:49 PM

## 2013-09-07 ENCOUNTER — Inpatient Hospital Stay (HOSPITAL_COMMUNITY)
Admission: AD | Admit: 2013-09-07 | Discharge: 2013-09-07 | Disposition: A | Discharge status: Home / Self Care | Payer: Self-pay | Source: Ambulatory Visit | Attending: Obstetrics and Gynecology | Admitting: Obstetrics and Gynecology

## 2013-09-07 ENCOUNTER — Inpatient Hospital Stay (HOSPITAL_COMMUNITY): Payer: Medicaid Other

## 2013-09-07 ENCOUNTER — Encounter (HOSPITAL_COMMUNITY): Payer: Self-pay | Admitting: *Deleted

## 2013-09-07 DIAGNOSIS — F17200 Nicotine dependence, unspecified, uncomplicated: Secondary | ICD-10-CM | POA: Insufficient documentation

## 2013-09-07 DIAGNOSIS — R109 Unspecified abdominal pain: Secondary | ICD-10-CM | POA: Insufficient documentation

## 2013-09-07 DIAGNOSIS — R197 Diarrhea, unspecified: Secondary | ICD-10-CM | POA: Insufficient documentation

## 2013-09-07 NOTE — MAU Provider Note (Signed)
Attestation of Attending Supervision of Advanced Practitioner (CNM/NP): Evaluation and management procedures were performed by the Advanced Practitioner under my supervision and collaboration.  I have reviewed the Advanced Practitioner's note and chart, and I agree with the management and plan.  Timira Bieda 09/07/2013 7:32 AM

## 2013-09-07 NOTE — MAU Note (Addendum)
Pt states she has had some cold like symptoms for the past 3 days pt states she took tylenol multiple symptom at about 2000 09/06/2013

## 2013-09-07 NOTE — Discharge Instructions (Signed)
Medicines During Pregnancy  During pregnancy, there are medicines that are either safe or unsafe to take. Medicines include prescriptions from your caregiver, over-the-counter medicines, topical creams applied to the skin, and all herbal substances. Medicines are put into either Class A, B, C, or D. Class A and B medicines have been shown to be safe in pregnancy. Class C medicines are also considered to be safe in pregnancy, but these medicines should only be used when necessary. Class D medicines should not be used at all in pregnancy. They can be harmful to a baby.   It is best to take as little medicine as possible while pregnant. However, some medicines are necessary to take for the mother and baby's health. Sometimes, it is more dangerous to stop taking certain medicines than to stay on them. This is often the case for people with long-term (chronic) conditions such as asthma, diabetes, or high blood pressure (hypertension). If you are pregnant and have a chronic illness, call your caregiver right away. Bring a list of your medicines and their doses to your appointments. If you are planning to become pregnant, schedule a doctor's appointment and discuss your medicines with your caregiver.  Lastly, write down the phone number to your pharmacist. They can answer questions regarding a medicine's class and safety. They cannot give advice as to whether you should or should not be on a medicine.   SAFE AND UNSAFE MEDICINES  There is a long list of medicines that are considered safe in pregnancy. Below is a shorter list. For specific medicines, ask your caregiver.   Allergy Medicines  Loratadine, cetirizine, and chlorpheniramine are safe to take. Certain nasal steroid sprays are safe. Talk to your caregiver about specific brands that are safe.  Analgesics  Acetaminophen and acetaminophen with codeine are safe to take. All other nonsteroidal anti-inflammatory drugs (NSAIDS) are not safe. This includes ibuprofen.    Antacids   Many over-the-counter antacids are safe to take. Talk to your caregiver about specific brands that are safe. Famotidine, ranitidine, and lansoprazole are safe. Omepresole is considered safe to take in the second trimester.  Antibiotic Medicines   There are several antibiotics to avoid. These include, but are not limited to, tetracyline, quinolones, and sulfa medications. Talk to your caregiver before taking any antibiotic.   Antihistamines   Talk to your caregiver about specific brands that are safe.   Asthma Medicines   Most asthma steroid inhalers are safe to take. Talk to your caregiver for specific details.  Calcium   Calcium supplements are safe to take. Do not take oyster shell calcium.   Cough and Cold Medicines   It is safe to take products with guaifenesin or dextromethorphan. Talk to your caregiver about specific brands that are safe. It is not safe to take products that contain aspirin or ibuprofen.  Decongestant Medicines  Pseudoephedrine-containing products are safe to take in the second and third trimester.   Depression Medicines   Talk about these medicines with your caregiver.   Antidiarrheal Medicines   It is safe to take loperamide. Talk to your caregiver about specific brands that are safe. It is not safe to take any antidiarrheal medicine that contains bismuth.  Eyedrops   Allergy eyedrops should be limited.   Iron   It is safe to use certain iron-containing medicines for anemia in pregnancy. They require a prescription.   Antinausea Medicines   It is safe to take doxylamine and vitamin B6 as directed. There are other prescription medicines available, if needed.   Sleep aids   It is safe to   take diphenhydramine and acetaminophen with diphenhydramine.   Steroids   Hydrocortisone creams are safe to use as directed. Oral steroids require a prescription. It is not safe to take any hemorrhoid cream with pramoxine or phenylephrine.  Stool softener   It is safe to take stool softener  medicines. Avoid daily or prolonged use of stool softeners.  Thyroid Medicine   It is important to stay on this thyroid medicine. It needs to be followed by your caregiver.   Vaginal Medicines   Your caregiver will prescribe a medicine to you if you have a vaginal infection. Certain antifungal medicines are safe to use if you have a sexually transmitted infection (STI). Talk to your caregiver.   Document Released: 08/18/2005 Document Revised: 11/10/2011 Document Reviewed: 08/19/2011  ExitCare® Patient Information ©2014 [employer].

## 2013-09-07 NOTE — MAU Provider Note (Signed)
  History     CSN: [id number]  Arrival date and time: 09/07/13 0024   None     Chief Complaint  Patient presents with  . Abdominal [id number]  . Diarrhea   Abdominal Cramping Associated symptoms include diarrhea.  Diarrhea     Jennifer Cohen is a 23 y.o. [id number] who presents today with "soft stools" x 3 days. She denies any nausea. Vomiting or fever. She states that she has been taking tylenol multiple symptoms, and is worried that it is not safe to take during pregnancy. She is planning on going to see Dr. Gaynell FaceMarshall for prenatal care. She has not made an appointment.   Past Medical History  Diagnosis Date  . No pertinent past medical history   . Depression     Past Surgical History  Procedure Laterality Date  . Cesarean section    . Brain surgery      Family History  Problem Relation Age of Onset  . Anesthesia problems Neg Hx   . Hypotension Neg Hx   . Malignant hyperthermia Neg Hx   . Pseudochol deficiency Neg Hx   . Alcohol abuse Neg Hx     History  Substance Use Topics  . Smoking status: Current Every Day Smoker -- 0.50 packs/day  . Smokeless tobacco: Never Used  . Alcohol Use: No    Allergies: No Known Allergies  Prescriptions prior to admission  Medication Sig Dispense Refill  . Prenatal Multivit-Min-Fe-FA (PRENATAL VITAMINS) 0.8 MG tablet Take 1 tablet by mouth daily.  90 tablet  3    Review of Systems  Gastrointestinal: Positive for diarrhea.   Physical Exam   Blood pressure 124/66, pulse 94, temperature 98.3 F (36.8 C), temperature source Oral, resp. rate 16, height 5' (1.524 m), weight 86.456 kg (190 lb 9.6 oz), last menstrual period 06/26/2013.  Physical Exam  Nursing note and vitals reviewed. Constitutional: She is oriented to person, place, and time. She appears well-developed and well-nourished. No distress.  Cardiovascular: Normal rate.   Respiratory: Effort normal.  GI: Soft. There is no tenderness.  Neurological: She is alert and  oriented to person, place, and time.  Skin: Skin is warm and dry.  Psychiatric: She has a normal mood and affect.    MAU Course  Procedures    Assessment and Plan   1. Diarrhea in adult patient    BRAT diet First trimester precautions reviewed Return to MAU as needed     Medication List         Prenatal Vitamins 0.8 MG tablet  Take 1 tablet by mouth daily.       Follow-up Information   Schedule an appointment as soon as possible for a visit with Kathreen CosierMARSHALL,BERNARD A, MD.   Specialty:  Obstetrics and Gynecology   Contact information:   [address] [place] [id number] [phone]        Tawnya CrookHogan, Dewayne Severe Donovan 09/07/2013, 1:30 AM

## 2013-09-07 NOTE — MAU Note (Signed)
Pt G4 P1 at 10.3wks with abdominal cramping and diarrhea x 3 today.

## 2013-09-07 NOTE — Progress Notes (Signed)
Pt states she has been having soft stool. For about 3 days

## 2013-10-13 ENCOUNTER — Encounter (HOSPITAL_COMMUNITY): Payer: Self-pay | Admitting: *Deleted

## 2013-10-13 ENCOUNTER — Inpatient Hospital Stay (HOSPITAL_COMMUNITY)
Admission: AD | Admit: 2013-10-13 | Discharge: 2013-10-13 | Disposition: A | Discharge status: Home / Self Care | Payer: Self-pay | Source: Ambulatory Visit | Attending: Obstetrics & Gynecology | Admitting: Obstetrics & Gynecology

## 2013-10-13 DIAGNOSIS — O99330 Smoking (tobacco) complicating pregnancy, unspecified trimester: Secondary | ICD-10-CM | POA: Insufficient documentation

## 2013-10-13 DIAGNOSIS — O26859 Spotting complicating pregnancy, unspecified trimester: Secondary | ICD-10-CM

## 2013-10-13 DIAGNOSIS — O26852 Spotting complicating pregnancy, second trimester: Secondary | ICD-10-CM

## 2013-10-13 DIAGNOSIS — R1032 Left lower quadrant pain: Secondary | ICD-10-CM | POA: Insufficient documentation

## 2013-10-13 LAB — WET PREP, GENITAL
CLUE CELLS WET PREP: NONE SEEN
Trich, Wet Prep: NONE SEEN
Yeast Wet Prep HPF POC: NONE SEEN

## 2013-10-13 LAB — URINALYSIS, ROUTINE W REFLEX MICROSCOPIC
Bilirubin Urine: NEGATIVE
Glucose, UA: 100 mg/dL — AB
KETONES UR: NEGATIVE mg/dL
Leukocytes, UA: NEGATIVE
NITRITE: NEGATIVE
PH: 6 (ref 5.0–8.0)
Protein, ur: NEGATIVE mg/dL
Urobilinogen, UA: 0.2 mg/dL (ref 0.0–1.0)

## 2013-10-13 LAB — URINE MICROSCOPIC-ADD ON

## 2013-10-13 NOTE — MAU Note (Signed)
Pt reports she started having some abd cramping and vg bleeding when she wiped earlier today.

## 2013-10-13 NOTE — Discharge Instructions (Signed)
Vaginal Bleeding During Pregnancy, Second Trimester °A small amount of bleeding (spotting) from the vagina is relatively common in pregnancy. It usually stops on its own. Various things can cause bleeding or spotting in pregnancy. Some bleeding may be related to the pregnancy, and some may not. Sometimes the bleeding is normal and is not a problem. However, bleeding can also be a sign of something serious. Be sure to tell your health care provider about any vaginal bleeding right away. °Some possible causes of vaginal bleeding during the second trimester include: °· Infection, inflammation, or growths on the cervix.   °· The placenta may be partially or completely covering the opening of the cervix inside the uterus (placenta previa). °· The placenta may have separated from the uterus (abruption of the placenta).   °· You may be having early (preterm) labor.   °· The cervix may not be strong enough to keep a baby inside the uterus (cervical insufficiency).   °· Tiny cysts may have developed in the uterus instead of pregnancy tissue (molar pregnancy).  °HOME CARE INSTRUCTIONS  °Watch your condition for any changes. The following actions may help to lessen any discomfort you are feeling: °· Follow your health care provider's instructions for limiting your activity. If your health care provider orders bed rest, you may need to stay in bed and only get up to use the bathroom. However, your health care provider may allow you to continue light activity. °· If needed, make plans for someone to help with your regular activities and responsibilities while you are on bed rest. °· Keep track of the number of pads you use each day, how often you change pads, and how soaked (saturated) they are. Write this down. °· Do not use tampons. Do not douche. °· Do not have sexual intercourse or orgasms until approved by your health care provider. °· If you pass any tissue from your vagina, save the tissue so you can show it to your  health care provider. °· Only take over-the-counter or prescription medicines as directed by your health care provider. °· Do not take aspirin because it can make you bleed. °· Do not exercise or perform any strenuous activities or heavy lifting without your health care provider's permission. °· Keep all follow-up appointments as directed by your health care provider. °SEEK MEDICAL CARE IF: °· You have any vaginal bleeding during any part of your pregnancy. °· You have cramps or labor pains. °SEEK IMMEDIATE MEDICAL CARE IF:  °· You have severe cramps in your back or belly (abdomen). °· You have contractions. °· You have a fever, not controlled by medicine. °· You have chills. °· You pass large clots or tissue from your vagina. °· Your bleeding increases. °· You feel lightheaded or weak, or you have fainting episodes. °· You are leaking fluid or have a gush of fluid from your vagina. °MAKE SURE YOU: °· Understand these instructions. °· Will watch your condition. °· Will get help right away if you are not doing well or get worse. °Document Released: 05/28/2005 Document Revised: 06/08/2013 Document Reviewed: 04/25/2013 °ExitCare® Patient Information ©2014 [employer]. ° °
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
## 2013-10-14 LAB — GC/CHLAMYDIA PROBE AMP
CT Probe RNA: NEGATIVE
GC Probe RNA: NEGATIVE

## 2013-10-14 NOTE — MAU Provider Note (Signed)
Attestation of Attending Supervision of Advanced Practitioner (PA/CNM/NP): Evaluation and management procedures were performed by the Advanced Practitioner under my supervision and collaboration.  I have reviewed the Advanced Practitioner's note and chart, and I agree with the management and plan.  Nhyira Leano Keirsey, MD, FACOG Attending Obstetrician & Gynecologist Faculty Practice, [facility]  

## 2013-11-22 LAB — OB RESULTS CONSOLE HEPATITIS B SURFACE ANTIGEN: Hepatitis B Surface Ag: NEGATIVE

## 2013-11-22 LAB — OB RESULTS CONSOLE HIV ANTIBODY (ROUTINE TESTING): HIV: NONREACTIVE

## 2013-11-22 LAB — OB RESULTS CONSOLE RPR: RPR: NONREACTIVE

## 2013-11-22 LAB — OB RESULTS CONSOLE ABO/RH: RH TYPE: POSITIVE

## 2013-11-22 LAB — OB RESULTS CONSOLE GC/CHLAMYDIA
CHLAMYDIA, DNA PROBE: NEGATIVE
Gonorrhea: NEGATIVE

## 2013-11-22 LAB — OB RESULTS CONSOLE RUBELLA ANTIBODY, IGM: Rubella: NON-IMMUNE/NOT IMMUNE

## 2013-11-22 LAB — OB RESULTS CONSOLE ANTIBODY SCREEN: Antibody Screen: NEGATIVE

## 2014-02-24 ENCOUNTER — Inpatient Hospital Stay (HOSPITAL_COMMUNITY)
Admission: AD | Admit: 2014-02-24 | Discharge: 2014-02-24 | Disposition: A | Discharge status: Home / Self Care | Payer: Medicaid Other | Source: Ambulatory Visit | Attending: Obstetrics | Admitting: Obstetrics

## 2014-02-24 ENCOUNTER — Encounter (HOSPITAL_COMMUNITY): Payer: Self-pay | Admitting: General Practice

## 2014-02-24 DIAGNOSIS — O99330 Smoking (tobacco) complicating pregnancy, unspecified trimester: Secondary | ICD-10-CM | POA: Insufficient documentation

## 2014-02-24 DIAGNOSIS — O26899 Other specified pregnancy related conditions, unspecified trimester: Secondary | ICD-10-CM

## 2014-02-24 DIAGNOSIS — R109 Unspecified abdominal pain: Secondary | ICD-10-CM

## 2014-02-24 DIAGNOSIS — O9989 Other specified diseases and conditions complicating pregnancy, childbirth and the puerperium: Secondary | ICD-10-CM

## 2014-02-24 DIAGNOSIS — O4700 False labor before 37 completed weeks of gestation, unspecified trimester: Secondary | ICD-10-CM | POA: Insufficient documentation

## 2014-02-24 DIAGNOSIS — E860 Dehydration: Secondary | ICD-10-CM

## 2014-02-24 DIAGNOSIS — O99891 Other specified diseases and conditions complicating pregnancy: Secondary | ICD-10-CM | POA: Insufficient documentation

## 2014-02-24 LAB — URINALYSIS, ROUTINE W REFLEX MICROSCOPIC
Bilirubin Urine: NEGATIVE
Glucose, UA: NEGATIVE mg/dL
Ketones, ur: NEGATIVE mg/dL
LEUKOCYTES UA: NEGATIVE
NITRITE: NEGATIVE
Protein, ur: NEGATIVE mg/dL
UROBILINOGEN UA: 0.2 mg/dL (ref 0.0–1.0)
pH: 6 (ref 5.0–8.0)

## 2014-02-24 LAB — URINE MICROSCOPIC-ADD ON

## 2014-02-24 LAB — WET PREP, GENITAL
Clue Cells Wet Prep HPF POC: NONE SEEN
TRICH WET PREP: NONE SEEN
YEAST WET PREP: NONE SEEN
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
## 2014-02-24 NOTE — MAU Note (Signed)
Pt presents to MAU with c/o left side abdominal pain since this am.

## 2014-02-24 NOTE — Discharge Instructions (Signed)
Abdominal Pain During Pregnancy Abdominal pain is common in pregnancy. Most of the time, it does not cause harm. There are many causes of abdominal pain. Some causes are more serious than others. Some of the causes of abdominal pain in pregnancy are easily diagnosed. Occasionally, the diagnosis takes time to understand. Other times, the cause is not determined. Abdominal pain can be a sign that something is very wrong with the pregnancy, or the pain may have nothing to do with the pregnancy at all. For this reason, always tell your health care provider if you have any abdominal discomfort. HOME CARE INSTRUCTIONS  Monitor your abdominal pain for any changes. The following actions may help to alleviate any discomfort you are experiencing:  Do not have sexual intercourse or put anything in your vagina until your symptoms go away completely.  Get plenty of rest until your pain improves.  Drink clear fluids if you feel nauseous. Avoid solid food as long as you are uncomfortable or nauseous.  Only take over-the-counter or prescription medicine as directed by your health care provider.  Keep all follow-up appointments with your health care provider. SEEK IMMEDIATE MEDICAL CARE IF:  You are bleeding, leaking fluid, or passing tissue from the vagina.  You have increasing pain or cramping.  You have persistent vomiting.  You have painful or bloody urination.  You have a fever.  You notice a decrease in your baby's movements.  You have extreme weakness or feel faint.  You have shortness of breath, with or without abdominal pain.  You develop a severe headache with abdominal pain.  You have abnormal vaginal discharge with abdominal pain.  You have persistent diarrhea.  You have abdominal pain that continues even after rest, or gets worse. MAKE SURE YOU:   Understand these instructions.  Will watch your condition.  Will get help right away if you are not doing well or get  worse. Document Released: 08/18/2005 Document Revised: 06/08/2013 Document Reviewed: 03/17/2013 [facility] [address]ExitCare Patient Information 2015 [place]. This information is not intended to replace advice given to you by your health care provider. Make sure you discuss any questions you have with your health care provider. Braxton Hicks Contractions Contractions of the uterus can occur throughout pregnancy. Contractions are not always a sign that you are in labor.  WHAT ARE BRAXTON HICKS CONTRACTIONS?  Contractions that occur before labor are called Braxton Hicks contractions, or false labor. Toward the end of pregnancy (32-34 weeks), these contractions can develop more often and may become more forceful. This is not true labor because these contractions do not result in opening (dilatation) and thinning of the cervix. They are sometimes difficult to tell apart from true labor because these contractions can be forceful and people have different pain tolerances. You should not feel embarrassed if you go to the hospital with false labor. Sometimes, the only way to tell if you are in true labor is for your health care provider to look for changes in the cervix. If there are no prenatal problems or other health problems associated with the pregnancy, it is completely safe to be sent home with false labor and await the onset of true labor. HOW CAN YOU TELL THE DIFFERENCE BETWEEN TRUE AND FALSE LABOR? False Labor  The contractions of false labor are usually shorter and not as hard as those of true labor.   The contractions are usually irregular.   The contractions are often felt in the front of the lower abdomen and in the groin.  groin.   °· The contractions may go away when you walk around or change positions while lying down.   °· The contractions get weaker and are shorter lasting as time goes on.   °· The contractions do not usually become progressively stronger, regular, and closer together as with true labor.    °True Labor °· Contractions in true labor last 30-70 seconds, become very regular, usually become more intense, and increase in frequency.   °· The contractions do not go away with walking.   °· The discomfort is usually felt in the top of the uterus and spreads to the lower abdomen and low back.   °· True labor can be determined by your health care provider with an exam. This will show that the cervix is dilating and getting thinner.   °WHAT TO REMEMBER °· Keep up with your usual exercises and follow other instructions given by your health care provider.   °· Take medicines as directed by your health care provider.   °· Keep your regular prenatal appointments.   °· Eat and drink lightly if you think you are going into labor.   °· If Braxton Hicks contractions are making you uncomfortable:   °¨ Change your position from lying down or resting to walking, or from walking to resting.   °¨ Sit and rest in a tub of warm water.   °¨ Drink 2-3 glasses of water. Dehydration may cause these contractions.   °¨ Do slow and deep breathing several times an hour.   °WHEN SHOULD I SEEK IMMEDIATE MEDICAL CARE? °Seek immediate medical care if: °· Your contractions become stronger, more regular, and closer together.   °· You have fluid leaking or gushing from your vagina.   °· You have a fever.   °· You pass blood-tinged mucus.   °· You have vaginal bleeding.   °· You have continuous abdominal pain.   °· You have low back pain that you never had before.   °· You feel your baby's head pushing down and causing pelvic pressure.   °· Your baby is not moving as much as it used to.   °Document Released: 08/18/2005 Document Revised: 08/23/2013 Document Reviewed: 05/30/2013 °ExitCare® Patient Information ©2015 [employer]. This information is not intended to replace advice given to you by your health care provider. Make sure you discuss any questions you have with your health care provider. ° °

## 2014-03-10 LAB — OB RESULTS CONSOLE GBS: STREP GROUP B AG: POSITIVE

## 2014-04-03 ENCOUNTER — Encounter (HOSPITAL_COMMUNITY): Payer: Self-pay

## 2014-04-03 ENCOUNTER — Inpatient Hospital Stay (HOSPITAL_COMMUNITY)
Admission: AD | Admit: 2014-04-03 | Discharge: 2014-04-03 | Disposition: A | Discharge status: Home / Self Care | Payer: Medicaid Other | Source: Ambulatory Visit | Attending: Obstetrics | Admitting: Obstetrics

## 2014-04-03 DIAGNOSIS — R109 Unspecified abdominal pain: Secondary | ICD-10-CM | POA: Insufficient documentation

## 2014-04-03 DIAGNOSIS — O479 False labor, unspecified: Secondary | ICD-10-CM | POA: Diagnosis not present

## 2014-04-03 MED ORDER — ZOLPIDEM TARTRATE 5 MG PO TABS
5.0000 mg | ORAL_TABLET | Freq: Once | ORAL | Status: AC | PRN
  Administered 2014-04-03: 5 mg via ORAL
  Filled 2014-04-03: qty 1

## 2014-04-03 NOTE — MAU Note (Signed)
Ctx & cramping tonight. Denies LOF or vaginal bleeding. Positive fetal movement. [facility] complications with this pregnancy[facility]

## 2014-04-03 NOTE — MAU Note (Signed)
Pt reports uc's since 0300. Denies LOF or VB.

## 2014-04-03 NOTE — Discharge Instructions (Signed)
Fetal Movement Counts °Patient Name: __________________________________________________ Patient Due Date: ____________________ °Performing a fetal movement count is highly recommended in high-risk pregnancies, but it is good for every pregnant woman to do. Your health care provider may ask you to start counting fetal movements at 28 weeks of the pregnancy. Fetal movements often increase: °· After eating a full meal. °· After physical activity. °· After eating or drinking something sweet or cold. °· At rest. °Pay attention to when you feel the baby is most active. This will help you notice a pattern of your baby's sleep and wake cycles and what factors contribute to an increase in fetal movement. It is important to perform a fetal movement count at the same time each day when your baby is normally most active.  °HOW TO COUNT FETAL MOVEMENTS °1. Find a quiet and comfortable area to sit or lie down on your left side. Lying on your left side provides the best blood and oxygen circulation to your baby. °2. Write down the day and time on a sheet of paper or in a journal. °3. Start counting kicks, flutters, swishes, rolls, or jabs in a 2-hour period. You should feel at least 10 movements within 2 hours. °4. If you do not feel 10 movements in 2 hours, wait 2-3 hours and count again. Look for a change in the pattern or not enough counts in 2 hours. °SEEK MEDICAL CARE IF: °· You feel less than 10 counts in 2 hours, tried twice. °· There is no movement in over an hour. °· The pattern is changing or taking longer each day to reach 10 counts in 2 hours. °· You feel the baby is not moving as he or she usually does. °Date: ____________ Movements: ____________ Start time: ____________ Finish time: ____________  °Date: ____________ Movements: ____________ Start time: ____________ Finish time: ____________ °Date: ____________ Movements: ____________ Start time: ____________ Finish time: ____________ °Date: ____________ Movements:  ____________ Start time: ____________ Finish time: ____________ °Date: ____________ Movements: ____________ Start time: ____________ Finish time: ____________ °Date: ____________ Movements: ____________ Start time: ____________ Finish time: ____________ °Date: ____________ Movements: ____________ Start time: ____________ Finish time: ____________ °Date: ____________ Movements: ____________ Start time: ____________ Finish time: ____________  °Date: ____________ Movements: ____________ Start time: ____________ Finish time: ____________ °Date: ____________ Movements: ____________ Start time: ____________ Finish time: ____________ °Date: ____________ Movements: ____________ Start time: ____________ Finish time: ____________ °Date: ____________ Movements: ____________ Start time: ____________ Finish time: ____________ °Date: ____________ Movements: ____________ Start time: ____________ Finish time: ____________ °Date: ____________ Movements: ____________ Start time: ____________ Finish time: ____________ °Date: ____________ Movements: ____________ Start time: ____________ Finish time: ____________  °Date: ____________ Movements: ____________ Start time: ____________ Finish time: ____________ °Date: ____________ Movements: ____________ Start time: ____________ Finish time: ____________ °Date: ____________ Movements: ____________ Start time: ____________ Finish time: ____________ °Date: ____________ Movements: ____________ Start time: ____________ Finish time: ____________ °Date: ____________ Movements: ____________ Start time: ____________ Finish time: ____________ °Date: ____________ Movements: ____________ Start time: ____________ Finish time: ____________ °Date: ____________ Movements: ____________ Start time: ____________ Finish time: ____________  °Date: ____________ Movements: ____________ Start time: ____________ Finish time: ____________ °Date: ____________ Movements: ____________ Start time: ____________ Finish  time: ____________ °Date: ____________ Movements: ____________ Start time: ____________ Finish time: ____________ °Date: ____________ Movements: ____________ Start time: ____________ Finish time: ____________ °Date: ____________ Movements: ____________ Start time: ____________ Finish time: ____________ °Date: ____________ Movements: ____________ Start time: ____________ Finish time: ____________ °Date: ____________ Movements: ____________ Start time: ____________ Finish time: ____________  °Date: ____________ Movements: ____________ Start time: ____________ Finish   time: ____________ °Date: ____________ Movements: ____________ Start time: ____________ Finish time: ____________ °Date: ____________ Movements: ____________ Start time: ____________ Finish time: ____________ °Date: ____________ Movements: ____________ Start time: ____________ Finish time: ____________ °Date: ____________ Movements: ____________ Start time: ____________ Finish time: ____________ °Date: ____________ Movements: ____________ Start time: ____________ Finish time: ____________ °Date: ____________ Movements: ____________ Start time: ____________ Finish time: ____________  °Date: ____________ Movements: ____________ Start time: ____________ Finish time: ____________ °Date: ____________ Movements: ____________ Start time: ____________ Finish time: ____________ °Date: ____________ Movements: ____________ Start time: ____________ Finish time: ____________ °Date: ____________ Movements: ____________ Start time: ____________ Finish time: ____________ °Date: ____________ Movements: ____________ Start time: ____________ Finish time: ____________ °Date: ____________ Movements: ____________ Start time: ____________ Finish time: ____________ °Date: ____________ Movements: ____________ Start time: ____________ Finish time: ____________  °Date: ____________ Movements: ____________ Start time: ____________ Finish time: ____________ °Date: ____________  Movements: ____________ Start time: ____________ Finish time: ____________ °Date: ____________ Movements: ____________ Start time: ____________ Finish time: ____________ °Date: ____________ Movements: ____________ Start time: ____________ Finish time: ____________ °Date: ____________ Movements: ____________ Start time: ____________ Finish time: ____________ °Date: ____________ Movements: ____________ Start time: ____________ Finish time: ____________ °Date: ____________ Movements: ____________ Start time: ____________ Finish time: ____________  °Date: ____________ Movements: ____________ Start time: ____________ Finish time: ____________ °Date: ____________ Movements: ____________ Start time: ____________ Finish time: ____________ °Date: ____________ Movements: ____________ Start time: ____________ Finish time: ____________ °Date: ____________ Movements: ____________ Start time: ____________ Finish time: ____________ °Date: ____________ Movements: ____________ Start time: ____________ Finish time: ____________ °Date: ____________ Movements: ____________ Start time: ____________ Finish time: ____________ °Document Released: 09/17/2006 Document Revised: 01/02/2014 Document Reviewed: 06/14/2012 °ExitCare® Patient Information ©2015 [employer]. This information is not intended to replace advice given to you by your health care provider. Make sure you discuss any questions you have with your health care provider. °Braxton Hicks Contractions °Contractions of the uterus can occur throughout pregnancy. Contractions are not always a sign that you are in labor.  °WHAT ARE BRAXTON HICKS CONTRACTIONS?  °Contractions that occur before labor are called Braxton Hicks contractions, or false labor. Toward the end of pregnancy (32-34 weeks), these contractions can develop more often and may become more forceful. This is not true labor because these contractions do not result in opening (dilatation) and thinning of the cervix. They  are sometimes difficult to tell apart from true labor because these contractions can be forceful and people have different pain tolerances. You should not feel embarrassed if you go to the hospital with false labor. Sometimes, the only way to tell if you are in true labor is for your health care provider to look for changes in the cervix. °If there are no prenatal problems or other health problems associated with the pregnancy, it is completely safe to be sent home with false labor and await the onset of true labor. °HOW CAN YOU TELL THE DIFFERENCE BETWEEN TRUE AND FALSE LABOR? °False Labor °· The contractions of false labor are usually shorter and not as hard as those of true labor.   °· The contractions are usually irregular.   °· The contractions are often felt in the front of the lower abdomen and in the groin.   °· The contractions may go away when you walk around or change positions while lying down.   °· The contractions get weaker and are shorter lasting as time goes on.   °· The contractions do not usually become progressively stronger, regular, and closer together as with true labor.   °True Labor °· Contractions in true labor last 30-70 seconds, become   very regular, usually become more intense, and increase in frequency.   °· The contractions do not go away with walking.   °· The discomfort is usually felt in the top of the uterus and spreads to the lower abdomen and low back.   °· True labor can be determined by your health care provider with an exam. This will show that the cervix is dilating and getting thinner.   °WHAT TO REMEMBER °· Keep up with your usual exercises and follow other instructions given by your health care provider.   °· Take medicines as directed by your health care provider.   °· Keep your regular prenatal appointments.   °· Eat and drink lightly if you think you are going into labor.   °· If Braxton Hicks contractions are making you uncomfortable:   °¨ Change your position from  lying down or resting to walking, or from walking to resting.   °¨ Sit and rest in a tub of warm water.   °¨ Drink 2-3 glasses of water. Dehydration may cause these contractions.   °¨ Do slow and deep breathing several times an hour.   °WHEN SHOULD I SEEK IMMEDIATE MEDICAL CARE? °Seek immediate medical care if: °· Your contractions become stronger, more regular, and closer together.   °· You have fluid leaking or gushing from your vagina.   °· You have a fever.   °· You pass blood-tinged mucus.   °· You have vaginal bleeding.   °· You have continuous abdominal pain.   °· You have low back pain that you never had before.   °· You feel your baby's head pushing down and causing pelvic pressure.   °· Your baby is not moving as much as it used to.   °Document Released: 08/18/2005 Document Revised: 08/23/2013 Document Reviewed: 05/30/2013 °ExitCare® Patient Information ©2015 [employer]. This information is not intended to replace advice given to you by your health care provider. Make sure you discuss any questions you have with your health care provider. ° °

## 2014-04-06 ENCOUNTER — Encounter (HOSPITAL_COMMUNITY): Payer: Self-pay | Admitting: *Deleted

## 2014-04-06 ENCOUNTER — Inpatient Hospital Stay (HOSPITAL_COMMUNITY)
Admission: AD | Admit: 2014-04-06 | Discharge: 2014-04-06 | Disposition: A | Discharge status: Home / Self Care | Payer: Medicaid Other | Source: Ambulatory Visit | Attending: Obstetrics | Admitting: Obstetrics

## 2014-04-06 DIAGNOSIS — M549 Dorsalgia, unspecified: Secondary | ICD-10-CM | POA: Insufficient documentation

## 2014-04-06 DIAGNOSIS — O99891 Other specified diseases and conditions complicating pregnancy: Secondary | ICD-10-CM | POA: Insufficient documentation

## 2014-04-06 DIAGNOSIS — O9989 Other specified diseases and conditions complicating pregnancy, childbirth and the puerperium: Principal | ICD-10-CM

## 2014-04-06 NOTE — MAU Note (Signed)
Pt. Was taking a shower about an hour ago and noticed mucous discharge. Also, back has been hurting since yesterday. States she is having contractions but has not been timing them. States yesterday back pain was intermittent and today it is dull and constant. Baby has been moving well today. Notes she saw a small amt. Of bright red bleeding on her underwear recently. Denies any other leakage of fluid.

## 2014-04-06 NOTE — Discharge Instructions (Signed)
Braxton Hicks Contractions °Contractions of the uterus can occur throughout pregnancy. Contractions are not always a sign that you are in labor.  °WHAT ARE BRAXTON HICKS CONTRACTIONS?  °Contractions that occur before labor are called Braxton Hicks contractions, or false labor. Toward the end of pregnancy (32-34 weeks), these contractions can develop more often and may become more forceful. This is not true labor because these contractions do not result in opening (dilatation) and thinning of the cervix. They are sometimes difficult to tell apart from true labor because these contractions can be forceful and people have different pain tolerances. You should not feel embarrassed if you go to the hospital with false labor. Sometimes, the only way to tell if you are in true labor is for your health care provider to look for changes in the cervix. °If there are no prenatal problems or other health problems associated with the pregnancy, it is completely safe to be sent home with false labor and await the onset of true labor. °HOW CAN YOU TELL THE DIFFERENCE BETWEEN TRUE AND FALSE LABOR? °False Labor °· The contractions of false labor are usually shorter and not as hard as those of true labor.   °· The contractions are usually irregular.   °· The contractions are often felt in the front of the lower abdomen and in the groin.   °· The contractions may go away when you walk around or change positions while lying down.   °· The contractions get weaker and are shorter lasting as time goes on.   °· The contractions do not usually become progressively stronger, regular, and closer together as with true labor.   °True Labor °· Contractions in true labor last 30-70 seconds, become very regular, usually become more intense, and increase in frequency.   °· The contractions do not go away with walking.   °· The discomfort is usually felt in the top of the uterus and spreads to the lower abdomen and low back.   °· True labor can be  determined by your health care provider with an exam. This will show that the cervix is dilating and getting thinner.   °WHAT TO REMEMBER °· Keep up with your usual exercises and follow other instructions given by your health care provider.   °· Take medicines as directed by your health care provider.   °· Keep your regular prenatal appointments.   °· Eat and drink lightly if you think you are going into labor.   °· If Braxton Hicks contractions are making you uncomfortable:   °¨ Change your position from lying down or resting to walking, or from walking to resting.   °¨ Sit and rest in a tub of warm water.   °¨ Drink 2-3 glasses of water. Dehydration may cause these contractions.   °¨ Do slow and deep breathing several times an hour.   °WHEN SHOULD I SEEK IMMEDIATE MEDICAL CARE? °Seek immediate medical care if: °· Your contractions become stronger, more regular, and closer together.   °· You have fluid leaking or gushing from your vagina.   °· You have a fever.   °· You pass blood-tinged mucus.   °· You have vaginal bleeding.   °· You have continuous abdominal pain.   °· You have low back pain that you never had before.   °· You feel your baby's head pushing down and causing pelvic pressure.   °· Your baby is not moving as much as it used to.   °Document Released: 08/18/2005 Document Revised: 08/23/2013 Document Reviewed: 05/30/2013 °ExitCare® Patient Information ©2015 [employer]. This information is not intended to replace advice given to you by your health care   provider. Make sure you discuss any questions you have with your health care provider. ° °

## 2014-04-07 ENCOUNTER — Encounter (HOSPITAL_COMMUNITY): Payer: Self-pay | Admitting: *Deleted

## 2014-04-07 ENCOUNTER — Inpatient Hospital Stay (HOSPITAL_COMMUNITY)
Admission: AD | Admit: 2014-04-07 | Discharge: 2014-04-09 | DRG: 775 | Disposition: A | Discharge status: Home / Self Care | Payer: Medicaid Other | Source: Ambulatory Visit | Attending: Obstetrics & Gynecology | Admitting: Obstetrics & Gynecology

## 2014-04-07 DIAGNOSIS — F1210 Cannabis abuse, uncomplicated: Secondary | ICD-10-CM | POA: Diagnosis present

## 2014-04-07 DIAGNOSIS — O99892 Other specified diseases and conditions complicating childbirth: Secondary | ICD-10-CM | POA: Diagnosis present

## 2014-04-07 DIAGNOSIS — O99344 Other mental disorders complicating childbirth: Secondary | ICD-10-CM | POA: Diagnosis present

## 2014-04-07 DIAGNOSIS — O479 False labor, unspecified: Secondary | ICD-10-CM | POA: Diagnosis present

## 2014-04-07 DIAGNOSIS — O34219 Maternal care for unspecified type scar from previous cesarean delivery: Secondary | ICD-10-CM | POA: Diagnosis present

## 2014-04-07 DIAGNOSIS — F3289 Other specified depressive episodes: Secondary | ICD-10-CM | POA: Diagnosis present

## 2014-04-07 DIAGNOSIS — F329 Major depressive disorder, single episode, unspecified: Secondary | ICD-10-CM | POA: Diagnosis present

## 2014-04-07 DIAGNOSIS — Z22330 Carrier of Group B streptococcus: Secondary | ICD-10-CM

## 2014-04-07 DIAGNOSIS — O9989 Other specified diseases and conditions complicating pregnancy, childbirth and the puerperium: Secondary | ICD-10-CM

## 2014-04-07 DIAGNOSIS — O328XX0 Maternal care for other malpresentation of fetus, not applicable or unspecified: Secondary | ICD-10-CM | POA: Diagnosis present

## 2014-04-07 LAB — CBC
HEMATOCRIT: 35.5 % — AB (ref 36.0–46.0)
HEMOGLOBIN: 12.1 g/dL (ref 12.0–15.0)
MCH: 25.8 pg — ABNORMAL LOW (ref 26.0–34.0)
MCHC: 34.1 g/dL (ref 30.0–36.0)
MCV: 75.7 fL — AB (ref 78.0–100.0)
Platelets: 363 10*3/uL (ref 150–400)
RBC: 4.69 MIL/uL (ref 3.87–5.11)
RDW: 15 % (ref 11.5–15.5)
WBC: 12.9 10*3/uL — ABNORMAL HIGH (ref 4.0–10.5)

## 2014-04-07 LAB — TYPE AND SCREEN
ABO/RH(D): A POS
ANTIBODY SCREEN: NEGATIVE

## 2014-04-07 MED ORDER — ONDANSETRON HCL 4 MG/2ML IJ SOLN: 4.0000 mg | INTRAMUSCULAR | Status: DC | PRN

## 2014-04-07 MED ORDER — OXYCODONE-ACETAMINOPHEN 5-325 MG PO TABS
1.0000 | ORAL_TABLET | ORAL | Status: DC | PRN
  Administered 2014-04-07: 1 via ORAL
  Filled 2014-04-07: qty 1

## 2014-04-07 MED ORDER — MEASLES, MUMPS & RUBELLA VAC ~~LOC~~ INJ
0.5000 mL | INJECTION | Freq: Once | SUBCUTANEOUS | Status: AC
  Administered 2014-04-09: 0.5 mL via SUBCUTANEOUS
  Filled 2014-04-07: qty 0.5

## 2014-04-07 MED ORDER — BENZOCAINE-MENTHOL 20-0.5 % EX AERO
1.0000 "application " | INHALATION_SPRAY | CUTANEOUS | Status: DC | PRN
  Administered 2014-04-08: 1 via TOPICAL
  Filled 2014-04-07: qty 56

## 2014-04-07 MED ORDER — MAGNESIUM HYDROXIDE 400 MG/5ML PO SUSP: 30.0000 mL | ORAL | Status: DC | PRN

## 2014-04-07 MED ORDER — TERBUTALINE SULFATE 1 MG/ML IJ SOLN: 0.2500 mg | Freq: Once | INTRAMUSCULAR | Status: DC | PRN

## 2014-04-07 MED ORDER — ZOLPIDEM TARTRATE 5 MG PO TABS: 5.0000 mg | ORAL_TABLET | Freq: Every evening | ORAL | Status: DC | PRN

## 2014-04-07 MED ORDER — FLEET ENEMA 7-19 GM/118ML RE ENEM
1.0000 | ENEMA | RECTAL | Status: DC | PRN
Start: 2014-04-07 — End: 2014-04-07

## 2014-04-07 MED ORDER — ACETAMINOPHEN 325 MG PO TABS: 650.0000 mg | ORAL_TABLET | ORAL | Status: DC | PRN

## 2014-04-07 MED ORDER — CITRIC ACID-SODIUM CITRATE 334-500 MG/5ML PO SOLN
30.0000 mL | ORAL | Status: DC | PRN
Start: 2014-04-07 — End: 2014-04-07

## 2014-04-07 MED ORDER — OXYTOCIN 40 UNITS IN LACTATED RINGERS INFUSION - SIMPLE MED: 62.5000 mL/h | INTRAVENOUS | Status: DC

## 2014-04-07 MED ORDER — ONDANSETRON HCL 4 MG/2ML IJ SOLN: 4.0000 mg | Freq: Four times a day (QID) | INTRAMUSCULAR | Status: DC | PRN

## 2014-04-07 MED ORDER — PENICILLIN G POTASSIUM 5000000 UNITS IJ SOLR
2.5000 10*6.[IU] | INTRAMUSCULAR | Status: DC
  Administered 2014-04-07: 2.5 10*6.[IU] via INTRAVENOUS
  Filled 2014-04-07 (×4): qty 2.5

## 2014-04-07 MED ORDER — DIBUCAINE 1 % RE OINT: 1.0000 "application " | TOPICAL_OINTMENT | RECTAL | Status: DC | PRN

## 2014-04-07 MED ORDER — SENNOSIDES-DOCUSATE SODIUM 8.6-50 MG PO TABS
2.0000 | ORAL_TABLET | ORAL | Status: DC
  Administered 2014-04-08 (×2): 2 via ORAL
  Filled 2014-04-07 (×2): qty 2

## 2014-04-07 MED ORDER — LACTATED RINGERS IV SOLN
INTRAVENOUS | Status: DC
  Administered 2014-04-07: 12:00:00 via INTRAVENOUS

## 2014-04-07 MED ORDER — PRENATAL MULTIVITAMIN CH
1.0000 | ORAL_TABLET | Freq: Every day | ORAL | Status: DC
  Administered 2014-04-08 – 2014-04-09 (×2): 1 via ORAL
  Filled 2014-04-07 (×2): qty 1

## 2014-04-07 MED ORDER — OXYTOCIN BOLUS FROM INFUSION: 500.0000 mL | INTRAVENOUS | Status: DC

## 2014-04-07 MED ORDER — FENTANYL CITRATE 0.05 MG/ML IJ SOLN
100.0000 ug | INTRAMUSCULAR | Status: DC | PRN
  Administered 2014-04-07: 100 ug via INTRAVENOUS
  Filled 2014-04-07: qty 2

## 2014-04-07 MED ORDER — LANOLIN HYDROUS EX OINT: TOPICAL_OINTMENT | CUTANEOUS | Status: DC | PRN

## 2014-04-07 MED ORDER — BUTORPHANOL TARTRATE 1 MG/ML IJ SOLN
1.0000 mg | INTRAMUSCULAR | Status: DC | PRN
  Administered 2014-04-07 (×2): 1 mg via INTRAVENOUS
  Filled 2014-04-07 (×3): qty 1

## 2014-04-07 MED ORDER — FERROUS SULFATE 325 (65 FE) MG PO TABS
325.0000 mg | ORAL_TABLET | Freq: Two times a day (BID) | ORAL | Status: DC
  Administered 2014-04-08 – 2014-04-09 (×3): 325 mg via ORAL
  Filled 2014-04-07 (×3): qty 1

## 2014-04-07 MED ORDER — ONDANSETRON HCL 4 MG PO TABS: 4.0000 mg | ORAL_TABLET | ORAL | Status: DC | PRN

## 2014-04-07 MED ORDER — LACTATED RINGERS IV SOLN
500.0000 mL | INTRAVENOUS | Status: DC | PRN
Start: 2014-04-07 — End: 2014-04-07

## 2014-04-07 MED ORDER — OXYCODONE-ACETAMINOPHEN 5-325 MG PO TABS
1.0000 | ORAL_TABLET | ORAL | Status: DC | PRN
  Administered 2014-04-08 (×4): 1 via ORAL
  Administered 2014-04-08 – 2014-04-09 (×3): 2 via ORAL
  Filled 2014-04-07 (×2): qty 2
  Filled 2014-04-07: qty 1
  Filled 2014-04-07: qty 2
  Filled 2014-04-07: qty 1
  Filled 2014-04-07: qty 2
  Filled 2014-04-07: qty 1

## 2014-04-07 MED ORDER — PENICILLIN G POTASSIUM 5000000 UNITS IJ SOLR
5.0000 10*6.[IU] | Freq: Once | INTRAVENOUS | Status: AC
  Administered 2014-04-07: 5 10*6.[IU] via INTRAVENOUS
  Filled 2014-04-07: qty 5

## 2014-04-07 MED ORDER — IBUPROFEN 600 MG PO TABS
600.0000 mg | ORAL_TABLET | Freq: Four times a day (QID) | ORAL | Status: DC | PRN
Start: 2014-04-07 — End: 2014-04-07
  Administered 2014-04-07: 600 mg via ORAL
  Filled 2014-04-07: qty 1

## 2014-04-07 MED ORDER — IBUPROFEN 600 MG PO TABS
600.0000 mg | ORAL_TABLET | Freq: Four times a day (QID) | ORAL | Status: DC
  Administered 2014-04-08 – 2014-04-09 (×7): 600 mg via ORAL
  Filled 2014-04-07 (×7): qty 1

## 2014-04-07 MED ORDER — OXYTOCIN 40 UNITS IN LACTATED RINGERS INFUSION - SIMPLE MED
1.0000 m[IU]/min | INTRAVENOUS | Status: DC
  Administered 2014-04-07: 2 m[IU]/min via INTRAVENOUS
  Filled 2014-04-07: qty 1000

## 2014-04-07 MED ORDER — TETANUS-DIPHTH-ACELL PERTUSSIS 5-2.5-18.5 LF-MCG/0.5 IM SUSP: 0.5000 mL | Freq: Once | INTRAMUSCULAR | Status: DC

## 2014-04-07 MED ORDER — WITCH HAZEL-GLYCERIN EX PADS: 1.0000 "application " | MEDICATED_PAD | CUTANEOUS | Status: DC | PRN

## 2014-04-07 MED ORDER — LIDOCAINE HCL (PF) 1 % IJ SOLN
30.0000 mL | INTRAMUSCULAR | Status: DC | PRN
  Administered 2014-04-07: 30 mL via SUBCUTANEOUS
  Filled 2014-04-07: qty 30

## 2014-04-07 MED ORDER — DIPHENHYDRAMINE HCL 25 MG PO CAPS: 25.0000 mg | ORAL_CAPSULE | Freq: Four times a day (QID) | ORAL | Status: DC | PRN

## 2014-04-07 NOTE — MAU Note (Signed)
C/o ?SROM @ 0940 this AM; c/o ucs since last night around 2130;

## 2014-04-07 NOTE — Progress Notes (Signed)
MD notified of FHR, UC pattern, SVE, time of SROM, and pt comfort level.  Orders given for pitocin augmentation starting at 2 milliunits/min and increasing by 2 milliunits/min q3145min, not to exceed 12 milliunits/min.

## 2014-04-07 NOTE — Progress Notes (Addendum)
Orders given to stop pitocin and restart if UCs space out.  Orders given for fentanyl q1h PRN.

## 2014-04-07 NOTE — MAU Provider Note (Signed)
S: 23 y.o. [id number] @[redacted]w[redacted]d  presents to MAU for r/o SROM and r/o labor. She had large gush of fluid this morning, enough to soak her underwear and onto her clothes with only light leakage of clear fluid now.  She reports good fetal movement, denies vaginal bleeding, vaginal itching/burning, urinary symptoms, h/a, dizziness, n/v, or fever/chills.    CNM called to room for speculum exam as initial evaluation with not enough fluid to make ferning slide.   O: BP 133/73  Pulse 107  Temp(Src) 97.9 F (36.6 C) (Oral)  Resp 18  Ht 5' (1.524 m)  Wt 93.895 kg (207 lb)  BMI 40.43 kg/m2  LMP 06/26/2013  Speculum exam with positive pooling of clear fluid and vernix noted  Ferning positive  Dilation: 2 Effacement (%): 70 Station: -2 Presentation: Vertex Exam by:: L Cohen CNM  A: SROM Active labor  P: RN to call Dr Calla KicksMarshall  Jennifer Cohen Certified Nurse-Midwife
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
## 2014-04-07 NOTE — MAU Note (Signed)
Patient states she had leaking fluid but no leaking at this time. States she is having contractions. Plans for TOLAC. Reports good fetal movement.

## 2014-04-08 LAB — RPR

## 2014-04-08 NOTE — Progress Notes (Signed)
Patient ID: Jennifer ConnersKristy Cohen, female   DOB: 07/19/1991, 23 y.o.   MRN: [id number] Post Partum Day 1 S/P spontaneous vaginal RH status/Rubella reviewed.  Feeding: bottle Subjective: No HA, SOB, CP, F/C, breast symptoms. Normal vaginal bleeding, no clots.     Objective: BP 114/67  Pulse 69  Temp(Src) 97.8 F (36.6 C) (Oral)  Resp 20  Ht 5' (1.524 m)  Wt 93.895 kg (207 lb)  BMI 40.43 kg/m2  SpO2 97%  LMP 06/26/2013  Breastfeeding? Unknown   Physical Exam:  General: alert Lochia: appropriate Uterine Fundus: firm DVT Evaluation: No evidence of DVT seen on physical exam. Ext: No c/c/e     Assessment/Plan: 23 y.o.  PPD #1 .  normal postpartum exam Continue current postpartum care Ambulate   LOS: 1 day   JACKSON-MOORE,Kincade Granberg A 04/08/2014, 11:24 AM

## 2014-04-08 NOTE — Lactation Note (Signed)
This note was copied from the chart of Jennifer Pamella Grounds. Lactation Consultation Note; Mother states she has decided to formula feed her baby.  Patient Name: Jennifer Farrel ConnersKristy Cohen ZOXWR'UToday's Date: 04/08/2014 Reason for consult:  (charting for exclusion)   Maternal Data Formula Feeding for Exclusion: Yes Reason for exclusion: Mother's choice to formula and breast feed on admission  Feeding    LATCH Score/Interventions                      Lactation Tools Discussed/Used     Consult Status Consult Status: Complete    Pamelia HoitWeeks, Nyeli Holtmeyer D 04/08/2014, 11:00 AM
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
## 2014-04-09 MED ORDER — OXYCODONE-ACETAMINOPHEN 5-325 MG PO TABS: 1.0000 | ORAL_TABLET | ORAL | Status: DC | PRN

## 2014-04-09 NOTE — Discharge Summary (Signed)
  Obstetric Discharge Summary Reason for Admission: onset of labor Prenatal Procedures: none Intrapartum Procedures: spontaneous vaginal delivery Postpartum Procedures: none Complications-Operative and Postpartum: none  Hemoglobin  Date Value Ref Range Status  04/07/2014 12.1  12.0 - 15.0 g/dL Final     HCT  Date Value Ref Range Status  04/07/2014 35.5* 36.0 - 46.0 % Final    Physical Exam:  General: alert Lochia: appropriate Uterine: firm Incision: n/a DVT Evaluation: No evidence of DVT seen on physical exam.  Discharge Diagnoses: Active Problems:   Active labor   VBAC, delivered, current hospitalization   Discharge Information: Date: 04/09/2014 Activity: pelvic rest Diet: routine Medications:  Prior to Admission medications   Medication Sig Start Date End Date Taking? Authorizing Provider  oxyCODONE-acetaminophen (PERCOCET/ROXICET) 5-325 MG per tablet Take 1-2 tablets by mouth every 4 (four) hours as needed for severe pain. 04/09/14   Antionette CharLisa Jackson-Moore, MD    Condition: stable Instructions: refer to routine discharge instructions Discharge to: home Follow-up Information   Schedule an appointment as soon as possible for a visit with Kathreen CosierMARSHALL,BERNARD A, MD.   Specialty:  Obstetrics and Gynecology   Contact information:   [address] [place] [address] [phone]       Newborn Data:  Live born female  Birth Weight: 6 lb 2 oz (2778 g) APGAR: 8, 9   Home with mother.  JACKSON-MOORE,Liyla Radliff A 04/09/2014, 9:46 AM

## 2014-04-09 NOTE — Discharge Instructions (Signed)
Postpartum Care After Vaginal Delivery After you deliver your baby, you will stay in the hospital for 24 to 72 hours, unless there were problems with the labor or delivery, or you have medical problems. While you are in the hospital, you will receive help and instructions on how to care for yourself and your baby. Your doctor will order pain medicine, in case you need it. You will have a small amount of bleeding from your vagina and should change your sanitary pad frequently. Wash your hands thoroughly with soap and water for at least 20 seconds after changing pads and using the toilet. Let the nurses know if you begin to pass blood clots or your bleeding increases. Do not flush blood clots down the toilet before having the nurse look at them, to make sure there is no placental tissue with them. If you had an intravenous, it will be removed within 24 hours, if there are no problems. The first time you get out of bed or take a shower, call the nurse to help you because you may get weak, lightheaded, or even faint. If you are breastfeeding, you may feel painful contractions of your uterus for a couple of weeks. This is normal. The contractions help your uterus get back to normal size. If you are not breastfeeding, wear a tight, binding bra and decrease your fluid intake. You may be given a medicine to dry up the milk in your breasts. Hormones should not be given to dry up the breasts, because they can cause blood clots. You will be given your normal diet, unless you have diabetes or other medical problems.  The nurses may put an ice pack on your episiotomy (surgically enlarged opening), if you have one, to reduce the pain and puffiness (swelling). On rare occasions, you may not be able to urinate and the nurse will need to empty your bladder with a catheter. If you had a postpartum tubal ligation ("tying tubes," female sterilization), it should not make your stay in the hospital longer. You may have your baby in  your room with you as much as you like, unless you or the baby has a problem. Use the bassinet (basket) for the baby when going to and from the nursery. Do not carry the baby. Do not leave the postpartum area. If the mother is Rh negative (lacks a protein on the red blood cells) and the baby is Rh positive, the mother should get a Rho-gam shot to prevent Rh problems with future pregnancies. You may be given written instructions for you and your baby, and necessary medicines, when you are discharged from the hospital. Be sure you understand and follow the instructions as advised. HOME CARE INSTRUCTIONS AFTER YOUR DELIVERY:  Follow instructions and take the medicines given to you.   Only take over-the-counter or prescription medicines for pain, discomfort, or fever as directed by your caregiver.   Do not take aspirin, because it can cause bleeding.   Increase your activities a little bit every day to build up your strength and endurance.   Do not drink alcohol, especially if you are breastfeeding or taking pain medicine.   Take your temperature twice a day and record it.   You may have a small amount of bleeding or spotting for 2 to 4 weeks. This is normal.   Do not use tampons or douche. Use sanitary pads.   Try to have someone stay and help you for a few days when you go home.     Try to rest or take a nap when the baby is sleeping.   If you are breastfeeding, wear a good support bra. If you are not breastfeeding, wear a tight bra, do not stimulate your nipples, and decrease your fluid intake.   Eat a healthy, nutritious diet and continue to take your prenatal vitamins.   Do not drive, do any heavy activities or travel until your caregiver tells you it is OK.   Do not have intercourse until your caregiver gives you permission to do so.   Ask your caregiver when you can begin to exercise and what type of exercises to do.   Call your caregiver if you think you are having a problem from  your delivery.   Call your pediatrician if you are having a problem with the baby.   Schedule your postpartum visit and keep it.  SEEK MEDICAL CARE IF:  You have a temperature of 100 F (37.8 C) or higher.   You have increased vaginal bleeding or are passing clots. Save any clots to show your caregiver.   You have bloody urine, or pain when you urinate.   You have a bad smelling vaginal discharge.   You have increasing pain or swelling on your episiotomy (surgically enlarged opening).   You develop a severe headache.   You feel depressed.   The episiotomy is separating.   You become dizzy or lightheaded.   You develop a rash.   You have a reaction or problems with your medicine.   You have pain, redness, and/or swelling at the intravenous site.  SEEK IMMEDIATE MEDICAL CARE IF:  You have chest pain.   You develop shortness of breath.   You pass out.   You develop pain, with or without swelling or redness in your leg.   You develop heavy vaginal bleeding, with or without blood clots.   You develop stomach pain.   You develop a bad smelling vaginal discharge.  MAKE SURE YOU:   Understand these instructions.   Will watch your condition.   Will get help right away if you are not doing well or get worse.  Document Released: 06/15/2007 Document Re-Released: 08/06/2009 [facility] Patient Information 2011 [place]. Contraception Choices Contraception (birth control) is the use of any methods or devices to prevent pregnancy. Below are some methods to help avoid pregnancy. HORMONAL METHODS   Contraceptive implant. This is a thin, plastic tube containing progesterone hormone. It does not contain estrogen hormone. Your health care provider inserts the tube in the inner part of the upper arm. The tube can remain in place for up to 3 years. After 3 years, the implant must be removed. The implant prevents the ovaries from releasing an egg (ovulation), thickens the  cervical mucus to prevent sperm from entering the uterus, and thins the lining of the inside of the uterus.  Progesterone-only injections. These injections are given every 3 months by your health care provider to prevent pregnancy. This synthetic progesterone hormone stops the ovaries from releasing eggs. It also thickens cervical mucus and changes the uterine lining. This makes it harder for sperm to survive in the uterus.  Birth control pills. These pills contain estrogen and progesterone hormone. They work by preventing the ovaries from releasing eggs (ovulation). They also cause the cervical mucus to thicken, preventing the sperm from entering the uterus. Birth control pills are prescribed by a health care provider.Birth control pills can also be used to treat heavy periods.  Minipill. This type of  birth control pill contains only the progesterone hormone. They are taken every day of each month and must be prescribed by your health care provider.  Birth control patch. The patch contains hormones similar to those in birth control pills. It must be changed once a week and is prescribed by a health care provider.  Vaginal ring. The ring contains hormones similar to those in birth control pills. It is left in the vagina for 3 weeks, removed for 1 week, and then a new one is put back in place. The patient must be comfortable inserting and removing the ring from the vagina.A health care provider's prescription is necessary.  Emergency contraception. Emergency contraceptives prevent pregnancy after unprotected sexual intercourse. This pill can be taken right after sex or up to 5 days after unprotected sex. It is most effective the sooner you take the pills after having sexual intercourse. Most emergency contraceptive pills are available without a prescription. Check with your pharmacist. Do not use emergency contraception as your only form of birth control. BARRIER METHODS   Female condom. This is a thin  sheath (latex or rubber) that is worn over the penis during sexual intercourse. It can be used with spermicide to increase effectiveness.  Female condom. This is a soft, loose-fitting sheath that is put into the vagina before sexual intercourse.  Diaphragm. This is a soft, latex, dome-shaped barrier that must be fitted by a health care provider. It is inserted into the vagina, along with a spermicidal jelly. It is inserted before intercourse. The diaphragm should be left in the vagina for 6 to 8 hours after intercourse.  Cervical cap. This is a round, soft, latex or plastic cup that fits over the cervix and must be fitted by a health care provider. The cap can be left in place for up to 48 hours after intercourse.  Sponge. This is a soft, circular piece of polyurethane foam. The sponge has spermicide in it. It is inserted into the vagina after wetting it and before sexual intercourse.  Spermicides. These are chemicals that kill or block sperm from entering the cervix and uterus. They come in the form of creams, jellies, suppositories, foam, or tablets. They do not require a prescription. They are inserted into the vagina with an applicator before having sexual intercourse. The process must be repeated every time you have sexual intercourse. INTRAUTERINE CONTRACEPTION  Intrauterine device (IUD). This is a T-shaped device that is put in a woman's uterus during a menstrual period to prevent pregnancy. There are 2 types:  Copper IUD. This type of IUD is wrapped in copper wire and is placed inside the uterus. Copper makes the uterus and fallopian tubes produce a fluid that kills sperm. It can stay in place for 10 years.  Hormone IUD. This type of IUD contains the hormone progestin (synthetic progesterone). The hormone thickens the cervical mucus and prevents sperm from entering the uterus, and it also thins the uterine lining to prevent implantation of a fertilized egg. The hormone can weaken or kill the  sperm that get into the uterus. It can stay in place for 3-5 years, depending on which type of IUD is used. PERMANENT METHODS OF CONTRACEPTION  Female tubal ligation. This is when the woman's fallopian tubes are surgically sealed, tied, or blocked to prevent the egg from traveling to the uterus.  Hysteroscopic sterilization. This involves placing a small coil or insert into each fallopian tube. Your doctor uses a technique called hysteroscopy to do the procedure. The  device causes scar tissue to form. This results in permanent blockage of the fallopian tubes, so the sperm cannot fertilize the egg. It takes about 3 months after the procedure for the tubes to become blocked. You must use another form of birth control for these 3 months.  Female sterilization. This is when the female has the tubes that carry sperm tied off (vasectomy).This blocks sperm from entering the vagina during sexual intercourse. After the procedure, the man can still ejaculate fluid (semen). NATURAL PLANNING METHODS  Natural family planning. This is not having sexual intercourse or using a barrier method (condom, diaphragm, cervical cap) on days the woman could become pregnant.  Calendar method. This is keeping track of the length of each menstrual cycle and identifying when you are fertile.  Ovulation method. This is avoiding sexual intercourse during ovulation.  Symptothermal method. This is avoiding sexual intercourse during ovulation, using a thermometer and ovulation symptoms.  Post-ovulation method. This is timing sexual intercourse after you have ovulated. Regardless of which type or method of contraception you choose, it is important that you use condoms to protect against the transmission of sexually transmitted infections (STIs). Talk with your health care provider about which form of contraception is most appropriate for you. Document Released: 08/18/2005 Document Revised: 08/23/2013 Document Reviewed:  02/10/2013 [facility] Patient Information 2015 [place], [place]. This information is not intended to replace advice given to you by your health care provider. Make sure you discuss any questions you have with your health care provider.

## 2014-04-10 NOTE — Progress Notes (Signed)
Post discharge ur review completed. 

## 2014-07-03 ENCOUNTER — Encounter (HOSPITAL_COMMUNITY): Payer: Self-pay | Admitting: *Deleted

## 2015-02-04 ENCOUNTER — Encounter (HOSPITAL_COMMUNITY): Payer: Self-pay | Admitting: *Deleted

## 2015-02-04 ENCOUNTER — Inpatient Hospital Stay (HOSPITAL_COMMUNITY)
Admission: AD | Admit: 2015-02-04 | Discharge: 2015-02-04 | Disposition: A | Discharge status: Home / Self Care | Payer: Medicaid Other | Source: Ambulatory Visit | Attending: Obstetrics and Gynecology | Admitting: Obstetrics and Gynecology

## 2015-02-04 ENCOUNTER — Inpatient Hospital Stay (HOSPITAL_COMMUNITY): Payer: Medicaid Other

## 2015-02-04 DIAGNOSIS — O26899 Other specified pregnancy related conditions, unspecified trimester: Secondary | ICD-10-CM

## 2015-02-04 DIAGNOSIS — R109 Unspecified abdominal pain: Secondary | ICD-10-CM | POA: Insufficient documentation

## 2015-02-04 DIAGNOSIS — O99331 Smoking (tobacco) complicating pregnancy, first trimester: Secondary | ICD-10-CM | POA: Insufficient documentation

## 2015-02-04 DIAGNOSIS — O9989 Other specified diseases and conditions complicating pregnancy, childbirth and the puerperium: Secondary | ICD-10-CM | POA: Diagnosis not present

## 2015-02-04 DIAGNOSIS — Z3A01 Less than 8 weeks gestation of pregnancy: Secondary | ICD-10-CM | POA: Insufficient documentation

## 2015-02-04 LAB — CBC
HCT: 37.5 % (ref 36.0–46.0)
HEMOGLOBIN: 13 g/dL (ref 12.0–15.0)
MCH: 25.7 pg — ABNORMAL LOW (ref 26.0–34.0)
MCHC: 34.7 g/dL (ref 30.0–36.0)
MCV: 74.1 fL — AB (ref 78.0–100.0)
Platelets: 387 10*3/uL (ref 150–400)
RBC: 5.06 MIL/uL (ref 3.87–5.11)
RDW: 15 % (ref 11.5–15.5)
WBC: 14 10*3/uL — ABNORMAL HIGH (ref 4.0–10.5)

## 2015-02-04 LAB — URINE MICROSCOPIC-ADD ON

## 2015-02-04 LAB — OB RESULTS CONSOLE GC/CHLAMYDIA: GC PROBE AMP, GENITAL: NEGATIVE

## 2015-02-04 LAB — URINALYSIS, ROUTINE W REFLEX MICROSCOPIC
BILIRUBIN URINE: NEGATIVE
GLUCOSE, UA: NEGATIVE mg/dL
Ketones, ur: NEGATIVE mg/dL
Nitrite: NEGATIVE
PROTEIN: NEGATIVE mg/dL
UROBILINOGEN UA: 0.2 mg/dL (ref 0.0–1.0)
pH: 6 (ref 5.0–8.0)

## 2015-02-04 LAB — WET PREP, GENITAL
Clue Cells Wet Prep HPF POC: NONE SEEN
TRICH WET PREP: NONE SEEN
Yeast Wet Prep HPF POC: NONE SEEN

## 2015-02-04 LAB — POCT PREGNANCY, URINE: Preg Test, Ur: POSITIVE — AB

## 2015-02-04 LAB — HCG, QUANTITATIVE, PREGNANCY: HCG, BETA CHAIN, QUANT, S: 55538 m[IU]/mL — AB (ref <5)

## 2015-02-04 LAB — HIV ANTIBODY (ROUTINE TESTING W REFLEX): HIV Screen 4th Generation wRfx: NONREACTIVE

## 2015-02-04 LAB — OB RESULTS CONSOLE GBS: GBS: NEGATIVE
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
## 2015-02-04 NOTE — MAU Note (Signed)
Having stomach pain and cramping for 2wks. LMP 4/24. No vag bleeding or d/c

## 2015-02-04 NOTE — Progress Notes (Signed)
Written and verbal d/c instructions given and understanding voiced. 

## 2015-02-05 LAB — CULTURE, OB URINE

## 2015-02-05 LAB — GC/CHLAMYDIA PROBE AMP (~~LOC~~) NOT AT ARMC
CHLAMYDIA, DNA PROBE: NEGATIVE
NEISSERIA GONORRHEA: NEGATIVE

## 2015-04-06 LAB — OB RESULTS CONSOLE RUBELLA ANTIBODY, IGM: RUBELLA: IMMUNE

## 2015-04-06 LAB — OB RESULTS CONSOLE ABO/RH: RH TYPE: POSITIVE

## 2015-04-06 LAB — OB RESULTS CONSOLE ANTIBODY SCREEN: Antibody Screen: NEGATIVE

## 2015-04-06 LAB — OB RESULTS CONSOLE RPR: RPR: NONREACTIVE

## 2015-04-06 LAB — OB RESULTS CONSOLE HIV ANTIBODY (ROUTINE TESTING): HIV: NONREACTIVE

## 2015-08-20 LAB — OB RESULTS CONSOLE GBS: STREP GROUP B AG: POSITIVE

## 2015-09-02 NOTE — L&D Delivery Note (Signed)
This patient has no babies on file.This patient has no babies on file.Delivery Note At 5:36 AM a viable female was delivered via  (Presentation: ;  ).  APGAR: 7, 9; weight  .   Placenta status: , .  Cord:  with the following complications: .  Cord pH: not done  Anesthesia:   Episiotomy:   Lacerations:   Suture Repair: 2.0 Est. Blood Loss (mL):    Mom to postpartum.  Baby to Couplet care / Skin to Skin.  MARSHALL,BERNARD A 09/19/2015, 6:13 AM

## 2015-09-16 ENCOUNTER — Inpatient Hospital Stay (HOSPITAL_COMMUNITY)
Admission: AD | Admit: 2015-09-16 | Discharge: 2015-09-16 | Disposition: A | Discharge status: Home / Self Care | Payer: Medicaid Other | Source: Ambulatory Visit | Attending: Obstetrics | Admitting: Obstetrics

## 2015-09-16 ENCOUNTER — Encounter (HOSPITAL_COMMUNITY): Payer: Self-pay

## 2015-09-16 DIAGNOSIS — O99333 Smoking (tobacco) complicating pregnancy, third trimester: Secondary | ICD-10-CM | POA: Diagnosis not present

## 2015-09-16 DIAGNOSIS — Z3A39 39 weeks gestation of pregnancy: Secondary | ICD-10-CM | POA: Diagnosis not present

## 2015-09-16 DIAGNOSIS — O26893 Other specified pregnancy related conditions, third trimester: Secondary | ICD-10-CM | POA: Insufficient documentation

## 2015-09-16 DIAGNOSIS — O99891 Other specified diseases and conditions complicating pregnancy: Secondary | ICD-10-CM

## 2015-09-16 DIAGNOSIS — M545 Low back pain: Secondary | ICD-10-CM | POA: Diagnosis present

## 2015-09-16 DIAGNOSIS — M549 Dorsalgia, unspecified: Secondary | ICD-10-CM

## 2015-09-16 DIAGNOSIS — O9989 Other specified diseases and conditions complicating pregnancy, childbirth and the puerperium: Secondary | ICD-10-CM

## 2015-09-16 MED ORDER — ACETAMINOPHEN 325 MG PO TABS
650.0000 mg | ORAL_TABLET | Freq: Once | ORAL | Status: AC
  Administered 2015-09-16: 650 mg via ORAL
  Filled 2015-09-16: qty 2
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
## 2015-09-16 NOTE — MAU Note (Signed)
Lower abd cramping and lower back pain for a wk. Denies LOF or bleeding

## 2015-09-16 NOTE — Discharge Instructions (Signed)
Try maternity support belt Take tylenol as needed per bottle instructions   Back Pain in Pregnancy Back pain during pregnancy is common. It happens in about half of all pregnancies. It is important for you and your baby that you remain active during your pregnancy.If you feel that back pain is not allowing you to remain active or sleep well, it is time to see your caregiver. Back pain may be caused by several factors related to changes during your pregnancy.Fortunately, unless you had trouble with your back before your pregnancy, the pain is likely to get better after you deliver. Low back pain usually occurs between the fifth and seventh months of pregnancy. It can, however, happen in the first couple months. Factors that increase the risk of back problems include:   Previous back problems.  Injury to your back.  Having twins or multiple births.  A chronic cough.  Stress.  Job-related repetitive motions.  Muscle or spinal disease in the back.  Family history of back problems, ruptured (herniated) discs, or osteoporosis.  Depression, anxiety, and panic attacks. CAUSES   When you are pregnant, your body produces a hormone called relaxin. This hormonemakes the ligaments connecting the low back and pubic bones more flexible. This flexibility allows the baby to be delivered more easily. When your ligaments are loose, your muscles need to work harder to support your back. Soreness in your back can come from tired muscles. Soreness can also come from back tissues that are irritated since they are receiving less support.  As the baby grows, it puts pressure on the nerves and blood vessels in your pelvis. This can cause back pain.  As the baby grows and gets heavier during pregnancy, the uterus pushes the stomach muscles forward and changes your center of gravity. This makes your back muscles work harder to maintain good posture. SYMPTOMS  Lumbar pain during pregnancy Lumbar pain during  pregnancy usually occurs at or above the waist in the center of the back. There may be pain and numbness that radiates into your leg or foot. This is similar to low back pain experienced by non-pregnant women. It usually increases with sitting for long periods of time, standing, or repetitive lifting. Tenderness may also be present in the muscles along your upper back. Posterior pelvic pain during pregnancy Pain in the back of the pelvis is more common than lumbar pain in pregnancy. It is a deep pain felt in your side at the waistline, or across the tailbone (sacrum), or in both places. You may have pain on one or both sides. This pain can also go into the buttocks and backs of the upper thighs. Pubic and groin pain may also be present. The pain does not quickly resolve with rest, and morning stiffness may also be present. Pelvic pain during pregnancy can be brought on by most activities. A high level of fitness before and during pregnancy may or may not prevent this problem. Labor pain is usually 1 to 2 minutes apart, lasts for about 1 minute, and involves a bearing down feeling or pressure in your pelvis. However, if you are at term with the pregnancy, constant low back pain can be the beginning of early labor, and you should be aware of this. DIAGNOSIS  X-rays of the back should not be done during the first 12 to 14 weeks of the pregnancy and only when absolutely necessary during the rest of the pregnancy. MRIs do not give off radiation and are safe during pregnancy. MRIs  also should only be done when absolutely necessary. HOME CARE INSTRUCTIONS  Exercise as directed by your caregiver. Exercise is the most effective way to prevent or manage back pain. If you have a back problem, it is especially important to avoid sports that require sudden body movements. Swimming and walking are great activities.  Do not stand in one place for long periods of time.  Do not wear high heels.  Sit in chairs with good  posture. Use a pillow on your lower back if necessary. Make sure your head rests over your shoulders and is not hanging forward.  Try sleeping on your side, preferably the left side, with a pillow or two between your legs. If you are sore after a night's rest, your bedmay betoo soft.Try placing a board between your mattress and box spring.  Listen to your body when lifting.If you are experiencing pain, ask for help or try bending yourknees more so you can use your leg muscles rather than your back muscles. Squat down when picking up something from the floor. Do not bend over.  Eat a healthy diet. Try to gain weight within your caregiver's recommendations.  Use heat or cold packs 3 to 4 times a day for 15 minutes to help with the pain.  Only take over-the-counter or prescription medicines for pain, discomfort, or fever as directed by your caregiver. Sudden (acute) back pain  Use bed rest for only the most extreme, acute episodes of back pain. Prolonged bed rest over 48 hours will aggravate your condition.  Ice is very effective for acute conditions.  Put ice in a plastic bag.  Place a towel between your skin and the bag.  Leave the ice on for 10 to 20 minutes every 2 hours, or as needed.  Using heat packs for 30 minutes prior to activities is also helpful. Continued back pain See your caregiver if you have continued problems. Your caregiver can help or refer you for appropriate physical therapy. With conditioning, most back problems can be avoided. Sometimes, a more serious issue may be the cause of back pain. You should be seen right away if new problems seem to be developing. Your caregiver may recommend:  A maternity girdle.  An elastic sling.  A back brace.  A massage therapist or acupuncture. SEEK MEDICAL CARE IF:   You are not able to do most of your daily activities, even when taking the pain medicine you were given.  You need a referral to a physical therapist or  chiropractor.  You want to try acupuncture. SEEK IMMEDIATE MEDICAL CARE IF:  You develop numbness, tingling, weakness, or problems with the use of your arms or legs.  You develop severe back pain that is no longer relieved with medicines.  You have a sudden change in bowel or bladder control.  You have increasing pain in other areas of the body.  You develop shortness of breath, dizziness, or fainting.  You develop nausea, vomiting, or sweating.  You have back pain which is similar to labor pains.  You have back pain along with your water breaking or vaginal bleeding.  You have back pain or numbness that travels down your leg.  Your back pain developed after you fell.  You develop pain on one side of your back. You may have a kidney stone.  You see blood in your urine. You may have a bladder infection or kidney stone.  You have back pain with blisters. You may have shingles. Back pain is  fairly common during pregnancy but should not be accepted as just part of the process. Back pain should always be treated as soon as possible. This will make your pregnancy as pleasant as possible.   This information is not intended to replace advice given to you by your health care provider. Make sure you discuss any questions you have with your health care provider.   Document Released: 11/26/2005 Document Revised: 11/10/2011 Document Reviewed: 01/07/2011 Elsevier Interactive Patient Education [employer]

## 2015-09-18 ENCOUNTER — Telehealth (HOSPITAL_COMMUNITY): Payer: Self-pay | Admitting: *Deleted

## 2015-09-18 ENCOUNTER — Encounter (HOSPITAL_COMMUNITY): Payer: Self-pay | Admitting: *Deleted

## 2015-09-18 ENCOUNTER — Other Ambulatory Visit: Payer: Self-pay | Admitting: Obstetrics

## 2015-09-18 NOTE — Telephone Encounter (Signed)
Preadmission screen  

## 2015-09-19 ENCOUNTER — Encounter (HOSPITAL_COMMUNITY): Payer: Self-pay

## 2015-09-19 ENCOUNTER — Inpatient Hospital Stay (HOSPITAL_COMMUNITY)
Admission: RE | Admit: 2015-09-19 | Discharge: 2015-09-21 | DRG: 775 | Disposition: A | Discharge status: Home / Self Care | Payer: Medicaid Other | Source: Ambulatory Visit | Attending: Obstetrics | Admitting: Obstetrics

## 2015-09-19 DIAGNOSIS — Z3A40 40 weeks gestation of pregnancy: Secondary | ICD-10-CM

## 2015-09-19 DIAGNOSIS — O99824 Streptococcus B carrier state complicating childbirth: Secondary | ICD-10-CM | POA: Diagnosis present

## 2015-09-19 DIAGNOSIS — O479 False labor, unspecified: Secondary | ICD-10-CM | POA: Diagnosis present

## 2015-09-19 HISTORY — DX: Nicotine dependence, unspecified, uncomplicated: F17.200

## 2015-09-19 HISTORY — DX: Cannabis use, unspecified, in remission: F12.91

## 2015-09-19 HISTORY — DX: Personal history of other specified conditions: Z87.898

## 2015-09-19 HISTORY — DX: History of uterine scar from previous surgery: Z98.891

## 2015-09-19 HISTORY — DX: Other specified postprocedural states: Z98.890

## 2015-09-19 LAB — CBC
HEMATOCRIT: 35.6 % — AB (ref 36.0–46.0)
Hemoglobin: 12.1 g/dL (ref 12.0–15.0)
MCH: 25.6 pg — ABNORMAL LOW (ref 26.0–34.0)
MCHC: 34 g/dL (ref 30.0–36.0)
MCV: 75.4 fL — AB (ref 78.0–100.0)
PLATELETS: 432 10*3/uL — AB (ref 150–400)
RBC: 4.72 MIL/uL (ref 3.87–5.11)
RDW: 15.5 % (ref 11.5–15.5)
WBC: 14.1 10*3/uL — AB (ref 4.0–10.5)

## 2015-09-19 LAB — TYPE AND SCREEN
ABO/RH(D): A POS
ANTIBODY SCREEN: NEGATIVE

## 2015-09-19 MED ORDER — BUTORPHANOL TARTRATE 1 MG/ML IJ SOLN
1.0000 mg | INTRAMUSCULAR | Status: DC | PRN
  Administered 2015-09-19: 1 mg via INTRAVENOUS
  Filled 2015-09-19: qty 1

## 2015-09-19 MED ORDER — WITCH HAZEL-GLYCERIN EX PADS: 1.0000 "application " | MEDICATED_PAD | CUTANEOUS | Status: DC | PRN

## 2015-09-19 MED ORDER — ONDANSETRON HCL 4 MG PO TABS
4.0000 mg | ORAL_TABLET | ORAL | Status: DC | PRN
Start: 2015-09-19 — End: 2015-09-21

## 2015-09-19 MED ORDER — ACETAMINOPHEN 325 MG PO TABS: 650.0000 mg | ORAL_TABLET | ORAL | Status: DC | PRN

## 2015-09-19 MED ORDER — CITRIC ACID-SODIUM CITRATE 334-500 MG/5ML PO SOLN: 30.0000 mL | ORAL | Status: DC | PRN

## 2015-09-19 MED ORDER — SENNOSIDES-DOCUSATE SODIUM 8.6-50 MG PO TABS
2.0000 | ORAL_TABLET | ORAL | Status: DC
  Administered 2015-09-19 – 2015-09-21 (×2): 2 via ORAL
  Filled 2015-09-19 (×2): qty 2

## 2015-09-19 MED ORDER — ZOLPIDEM TARTRATE 5 MG PO TABS: 5.0000 mg | ORAL_TABLET | Freq: Every evening | ORAL | Status: DC | PRN

## 2015-09-19 MED ORDER — OXYTOCIN 10 UNIT/ML IJ SOLN: 2.5000 [IU]/h | INTRAVENOUS | Status: DC

## 2015-09-19 MED ORDER — TETANUS-DIPHTH-ACELL PERTUSSIS 5-2.5-18.5 LF-MCG/0.5 IM SUSP: 0.5000 mL | Freq: Once | INTRAMUSCULAR | Status: DC

## 2015-09-19 MED ORDER — BENZOCAINE-MENTHOL 20-0.5 % EX AERO
1.0000 "application " | INHALATION_SPRAY | CUTANEOUS | Status: DC | PRN
  Filled 2015-09-19: qty 56

## 2015-09-19 MED ORDER — FLEET ENEMA 7-19 GM/118ML RE ENEM: 1.0000 | ENEMA | RECTAL | Status: DC | PRN

## 2015-09-19 MED ORDER — IBUPROFEN 600 MG PO TABS
600.0000 mg | ORAL_TABLET | Freq: Four times a day (QID) | ORAL | Status: DC
Start: 2015-09-19 — End: 2015-09-21
  Administered 2015-09-19 – 2015-09-21 (×9): 600 mg via ORAL
  Filled 2015-09-19 (×9): qty 1

## 2015-09-19 MED ORDER — PRENATAL MULTIVITAMIN CH
1.0000 | ORAL_TABLET | Freq: Every day | ORAL | Status: DC
Start: 2015-09-19 — End: 2015-09-21
  Administered 2015-09-19 – 2015-09-20 (×2): 1 via ORAL
  Filled 2015-09-19 (×2): qty 1

## 2015-09-19 MED ORDER — LACTATED RINGERS IV SOLN
500.0000 mL | INTRAVENOUS | Status: DC | PRN
  Administered 2015-09-19: 250 mL via INTRAVENOUS

## 2015-09-19 MED ORDER — ONDANSETRON HCL 4 MG/2ML IJ SOLN: 4.0000 mg | INTRAMUSCULAR | Status: DC | PRN

## 2015-09-19 MED ORDER — LIDOCAINE HCL (PF) 1 % IJ SOLN: 30.0000 mL | INTRAMUSCULAR | Status: DC | PRN

## 2015-09-19 MED ORDER — LACTATED RINGERS IV SOLN
INTRAVENOUS | Status: DC
  Administered 2015-09-19: 02:00:00 via INTRAVENOUS

## 2015-09-19 MED ORDER — TERBUTALINE SULFATE 1 MG/ML IJ SOLN: 0.2500 mg | Freq: Once | INTRAMUSCULAR | Status: DC | PRN

## 2015-09-19 MED ORDER — PENICILLIN G POTASSIUM 5000000 UNITS IJ SOLR
2.5000 10*6.[IU] | INTRAVENOUS | Status: DC
  Filled 2015-09-19 (×3): qty 2.5

## 2015-09-19 MED ORDER — DIBUCAINE 1 % RE OINT
1.0000 "application " | TOPICAL_OINTMENT | RECTAL | Status: DC | PRN
  Filled 2015-09-19: qty 28

## 2015-09-19 MED ORDER — OXYCODONE-ACETAMINOPHEN 5-325 MG PO TABS
1.0000 | ORAL_TABLET | ORAL | Status: DC | PRN
  Administered 2015-09-19 – 2015-09-20 (×5): 1 via ORAL
  Filled 2015-09-19 (×5): qty 1

## 2015-09-19 MED ORDER — OXYTOCIN BOLUS FROM INFUSION
500.0000 mL | INTRAVENOUS | Status: DC
  Administered 2015-09-19: 500 mL via INTRAVENOUS

## 2015-09-19 MED ORDER — SIMETHICONE 80 MG PO CHEW: 80.0000 mg | CHEWABLE_TABLET | ORAL | Status: DC | PRN

## 2015-09-19 MED ORDER — OXYCODONE-ACETAMINOPHEN 5-325 MG PO TABS: 2.0000 | ORAL_TABLET | ORAL | Status: DC | PRN

## 2015-09-19 MED ORDER — DIPHENHYDRAMINE HCL 25 MG PO CAPS: 25.0000 mg | ORAL_CAPSULE | Freq: Four times a day (QID) | ORAL | Status: DC | PRN

## 2015-09-19 MED ORDER — ONDANSETRON HCL 4 MG/2ML IJ SOLN: 4.0000 mg | Freq: Four times a day (QID) | INTRAMUSCULAR | Status: DC | PRN

## 2015-09-19 MED ORDER — FERROUS SULFATE 325 (65 FE) MG PO TABS
325.0000 mg | ORAL_TABLET | Freq: Two times a day (BID) | ORAL | Status: DC
  Administered 2015-09-19 – 2015-09-21 (×5): 325 mg via ORAL
  Filled 2015-09-19 (×6): qty 1

## 2015-09-19 MED ORDER — LANOLIN HYDROUS EX OINT: TOPICAL_OINTMENT | CUTANEOUS | Status: DC | PRN

## 2015-09-19 MED ORDER — PENICILLIN G POTASSIUM 5000000 UNITS IJ SOLR
5.0000 10*6.[IU] | Freq: Once | INTRAVENOUS | Status: AC
  Administered 2015-09-19: 5 10*6.[IU] via INTRAVENOUS
  Filled 2015-09-19: qty 5

## 2015-09-19 MED ORDER — LACTATED RINGERS IV SOLN
1.0000 m[IU]/min | INTRAVENOUS | Status: DC
  Administered 2015-09-19: 2 m[IU]/min via INTRAVENOUS
  Filled 2015-09-19: qty 4

## 2015-09-19 NOTE — Progress Notes (Signed)
I was called to the patient's room to stand-by for delivery. When I arrived the head had delivered and I was told approximately 30 seconds had transpired. Downward traction failed to deliver the anterior shoulder. Patient placed in McRoberts position and fundal pressure was applied but this failed to succeed in delivering the anterior shoulder. The head was right occiput transverse. I inserted two fingers behind the anterior shoulder and 2 fingers in front of the posterior clavicle and rotated the infant in a clockwise position, and the infant delivered easily. Total time of dystocia: 2 minutes. Cord clamped and cut and infant transferred to infant warmer. Cord blood collected, pitocin started, and active mgmt of 3rd stage. Patient's provider then arrived to assume care. No significant bleeding.

## 2015-09-19 NOTE — Consult Note (Signed)
Delivery Team called Code Apgar to Room 164 for shoulder dystocia.   Team dismissed upon arrival since infant was already born.     Overton Mam, MD (Attending Neonatologist)

## 2015-09-19 NOTE — H&P (Signed)
This is Dr. Francoise Ceo dictating the history and physical on  Jennifer Cohen she is a 25 year old gravida 3 para 202 EDC 09/17/2015 brought in for induction she was 40 weeks and 2 days cervix 2 cm 80% vertex -1 positive GBS she got penicillin and on admission was started on Pitocin her membranes ruptured spontaneously at 325  Next call i received  received was that day patient was delivering in bed unattended when I got here it was stated that the patient was just recently 5 cm and the nurse was in the room and the patient stat precepting and Dr. walk came delivered the baby who had shoulder dystocia Apgars were 8 and 9 I delivered the placenta there were no lacerations and the patient did well Past medical history negative Past surgical history negative Social history negative System review negative Physical exam well-developed female post delivery HEENT negative Lungs clear to P&A Heart regular rhythm no murmurs no gallops Breasts negative Abdomen 20 week size postpartum uterus Pelvic as described above Extremities negative

## 2015-09-20 LAB — CBC
HEMATOCRIT: 35.2 % — AB (ref 36.0–46.0)
HEMOGLOBIN: 11.6 g/dL — AB (ref 12.0–15.0)
MCH: 25.2 pg — AB (ref 26.0–34.0)
MCHC: 33 g/dL (ref 30.0–36.0)
MCV: 76.4 fL — AB (ref 78.0–100.0)
Platelets: 394 10*3/uL (ref 150–400)
RBC: 4.61 MIL/uL (ref 3.87–5.11)
RDW: 15.6 % — AB (ref 11.5–15.5)
WBC: 15.5 10*3/uL — ABNORMAL HIGH (ref 4.0–10.5)

## 2015-09-20 LAB — RPR: RPR: NONREACTIVE

## 2015-09-20 MED ORDER — OXYCODONE-ACETAMINOPHEN 5-325 MG PO TABS
1.0000 | ORAL_TABLET | ORAL | Status: DC | PRN
  Administered 2015-09-20: 1 via ORAL
  Administered 2015-09-20 – 2015-09-21 (×5): 2 via ORAL
  Filled 2015-09-20 (×4): qty 2
  Filled 2015-09-20: qty 1
  Filled 2015-09-20: qty 2

## 2015-09-20 NOTE — Progress Notes (Signed)
Patient ID: Jennifer Cohen, female   DOB: [birth year], 25 y.o.   MRN: [id number] Postpartum day one Blood pressure 09/26/1966 afebrile pulse 57 respiration 18 Fundus firm Lochia moderate Legs negative doing well

## 2015-09-20 NOTE — Progress Notes (Signed)
CLINICAL SOCIAL WORK MATERNAL/CHILD NOTE  Patient Details  Name: Jennifer Cohen MRN: [id number] Date of Birth: [birth year]  Date:  04/10/2016  Clinical Social Worker Initiating Note:  Elissa Hefty, MSW intern  Date/ Time Initiated:  09/20/15/0900     Child's Name:  Jennifer Cohen    Legal Guardian:  Marita [place] and Oley Balm    Need for Interpreter:  None   Date of Referral:  Dec 30, 2015     Reason for Referral:  Behavioral Health Issues, including SI , Current Substance Use/Substance Use During Pregnancy    Referral Source:  [facility]   Address:  [place], [place] [address]  Phone number:  [phone]   Household Members:  Self, Parents, Minor Children, Siblings   Natural Supports (not living in the home):  Immediate Family, Parent, Spouse/significant other   Professional Supports: None   Employment: Unemployed   Type of Work:     Education:  Database administrator Resources:  Medicaid   Other Resources:  [facility]   Cultural/Religious Considerations Which May Impact Care:  None Reported   Strengths:  Ability to meet basic needs , Home prepared for child    Risk Factors/Current Problems:   Substance Use- MOB reported smoking marijuana until she was five months pregnant along with smoking cigarettes.  Mental Health Concerns- Per MOB, she experienced symptoms of depression about two years ago because of problems she was having with FOB. MOB shared that she self-diagnosed and did not seek medical care because she did not find it necessary. MOB disclosed she smoked marijuana to help her cope with the feelings of sadness and not wanting to associate with others.   Cognitive State:  Able to Concentrate , Insightful , Linear Thinking    Mood/Affect:  Relaxed , Interested , Calm , Comfortable    CSW Assessment: MSW intern presented in patient's room due to a consult being placed by central nursery because of a history of depression and  THC use during the pregnancy. MOB was caring for the infant when MSW intern entered the room and provided verbal consent for her to engage. MOB presented to be in a pleasant mood as evidence by her caring for the infant and answering questions openly. Per MOB, she is transitioning well into postpartum and is bottle feeding the infant. MOB shared she has two other children ages 12 and 63 and are currently with her mother while she is in the hospital. According to [facility], she lives with her mother, siblings, and children. MOB stated she is currently unemployed but plans to seek employment in the near future. MOB shared she is prepared to take the infant home and has met all of the infant's basic needs. MOB voiced feeling well supported by FOB and her family.   MSW intern inquired about MOB's mental health during the pregnancy. MOB denied any mental health concerns during her pregnancy. MSW asked MOB about her history of depression. MOB reported she experienced depressive  symptoms two years ago when she was having problems with FOB. MOB described her symptoms as feelings of sadness, occasionally crying, and isolating herself from others. MOB shared she self-diagnosed herself and never seek medical help because she didn't find it necessary. Per MOB, she coped with the depressive symptoms by smoking 2-3 "blunts" of marijuana a day along with 10 cigarettes a day. MOB shared the marijuana use helped her feel relaxed and made her stop thinking and "over worrying" about her relationships emotional problems at  the time. MSW intern asked MOB if there was marijuana use during the pregnancy. MOB reported she smoked marijuana occasionally during the pregnancy until she was about 5 months pregnant. MOB displays minimal readiness to disengage from marijuana use and does not view it as a harmful behavior.   MSW intern provided education about the hospital's drug screen policy. MOB responded that she knew the procedures and had been  through this before with their one year old daughter. MOB denied any concerns and was accepting of a CPS report being filed in the case that the results were positive. Per chart review, infant's UDS is negative and the cord tissue results are still pending. MOB was appreciative about MSW intern informing her about the drug screens taken and going over the hospital's policies.   MSW intern provided education on perinatal mood disorders. MOB denied any symptoms of depression during her pregnancy and shared that the relationship with FOB was good. MOB denied any history of abuse in the relationship. MSW intern asked MOB how she felt about  Seeking medical help in the future if needs arise instead on relying on marijuana to help her with her depression symptoms. MOB expressed she wouldn't be opposed to seeking medical help if needs arise but did not see the need as a moment because she had no mental health concerns. MOB started to get distracted by playing in her cell phone and loose engagement. MSW intern asked MOB if she had any further questions or concerns . MOB asked about where FOB had to go to sign the birth certificate. MOB agreed to contact MSW intern if needs arise and thanked her for the information provided.   CSW Plan/Description:   Patient/Family Education- MSW intern provided education on perinatal mood disorders.  MSW intern to monitor cord tissue drug screen, UDS is negative, and file a CPS report if necessary.  No Further Intervention Required/No Barriers to Discharge     Audria Takeshita, Student-SW 09/20/2015, 9:40 AM  

## 2015-09-21 NOTE — Discharge Summary (Signed)
Obstetric Discharge Summary Reason for Admission: induction of labor Prenatal Procedures: none Intrapartum Procedures: spontaneous vaginal delivery Postpartum Procedures: none Complications-Operative and Postpartum: none HEMOGLOBIN  Date Value Ref Range Status  09/20/2015 11.6* 12.0 - 15.0 g/dL Final   HCT  Date Value Ref Range Status  09/20/2015 35.2* 36.0 - 46.0 % Final    Physical Exam:  General: alert Lochia: appropriate Uterine Fundus: firm Incision: healing well DVT Evaluation: No evidence of DVT seen on physical exam.  Discharge Diagnoses: Term Pregnancy-delivered  Discharge Information: Date: 09/21/2015 Activity: pelvic rest Diet: routine Medications: Percocet Condition: improved Instructions: refer to practice specific booklet Discharge to: home Follow-up Information    Follow up with Kathreen Cosier, MD.   Specialty:  Obstetrics and Gynecology   Contact information:   [address] [place] [address] [phone]       Newborn Data: Live born female  Birth Weight: 6 lb 14.1 oz (3120 g) APGAR: 7, 9  Home with mother.  Jennifer Cohen A 09/21/2015, 6:08 AM

## 2015-09-21 NOTE — Progress Notes (Signed)
Patient ID: Jennifer Cohen, female   DOB: 1990/09/14, 25 y.o.   MRN: [id number] Postpartum day 2 Blood pressure 138/82 pulse 69 respiration 18 Fundus firm Lochia moderate Legs negative home today

## 2015-09-21 NOTE — Discharge Instructions (Signed)
Discharge instructions   You can wash your hair  Shower  Eat what you want  Drink what you want  See me in 6 weeks  Your ankles are going to swell more in the next 2 weeks than when pregnant  No sex for 6 weeks   Greyden Besecker A, MD 09/21/2015

## 2017-02-19 ENCOUNTER — Encounter (HOSPITAL_COMMUNITY): Payer: Self-pay

## 2017-02-19 ENCOUNTER — Inpatient Hospital Stay (HOSPITAL_COMMUNITY)
Admission: AD | Admit: 2017-02-19 | Discharge: 2017-02-19 | Disposition: A | Discharge status: Home / Self Care | Payer: Medicaid Other | Source: Ambulatory Visit | Attending: Obstetrics & Gynecology | Admitting: Obstetrics & Gynecology

## 2017-02-19 DIAGNOSIS — F329 Major depressive disorder, single episode, unspecified: Secondary | ICD-10-CM | POA: Insufficient documentation

## 2017-02-19 DIAGNOSIS — N938 Other specified abnormal uterine and vaginal bleeding: Secondary | ICD-10-CM | POA: Diagnosis not present

## 2017-02-19 DIAGNOSIS — F17210 Nicotine dependence, cigarettes, uncomplicated: Secondary | ICD-10-CM | POA: Diagnosis not present

## 2017-02-19 DIAGNOSIS — N930 Postcoital and contact bleeding: Secondary | ICD-10-CM | POA: Diagnosis present

## 2017-02-19 LAB — WET PREP, GENITAL
CLUE CELLS WET PREP: NONE SEEN
SPERM: NONE SEEN
TRICH WET PREP: NONE SEEN
YEAST WET PREP: NONE SEEN

## 2017-02-19 LAB — URINALYSIS, ROUTINE W REFLEX MICROSCOPIC
Bilirubin Urine: NEGATIVE
Glucose, UA: NEGATIVE mg/dL
KETONES UR: NEGATIVE mg/dL
Leukocytes, UA: NEGATIVE
Nitrite: NEGATIVE
PH: 6 (ref 5.0–8.0)
Protein, ur: NEGATIVE mg/dL
SPECIFIC GRAVITY, URINE: 1.012 (ref 1.005–1.030)

## 2017-02-19 LAB — POCT PREGNANCY, URINE: Preg Test, Ur: NEGATIVE

## 2017-02-19 MED ORDER — NORGESTIMATE-ETH ESTRADIOL 0.25-35 MG-MCG PO TABS: 1.0000 | ORAL_TABLET | Freq: Every day | ORAL | 0 refills | Status: DC

## 2017-02-19 NOTE — Discharge Instructions (Signed)

## 2017-02-19 NOTE — MAU Note (Signed)
Pt states that she finished menstrual cycle 02/07/2017 and had been on cycle x 3 weeks, which is abnormal for her. Pt. States that she had intercourse two days ago and began bleeding again. Pt. Has had light spotting. Pt. Denies abdominal pain.
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
## 2017-02-20 LAB — GC/CHLAMYDIA PROBE AMP (~~LOC~~) NOT AT ARMC
Chlamydia: NEGATIVE
NEISSERIA GONORRHEA: NEGATIVE

## 2017-03-03 ENCOUNTER — Ambulatory Visit: Payer: Medicaid Other | Admitting: Obstetrics and Gynecology

## 2017-04-07 ENCOUNTER — Ambulatory Visit (INDEPENDENT_AMBULATORY_CARE_PROVIDER_SITE_OTHER): Payer: Medicaid Other

## 2017-04-07 DIAGNOSIS — Z3201 Encounter for pregnancy test, result positive: Secondary | ICD-10-CM | POA: Diagnosis not present

## 2017-04-07 DIAGNOSIS — Z3200 Encounter for pregnancy test, result unknown: Secondary | ICD-10-CM

## 2017-04-07 LAB — POCT URINE PREGNANCY: PREG TEST UR: POSITIVE — AB

## 2017-04-07 NOTE — Progress Notes (Signed)
Pt here for a pregnancy. Test is positive. Pt states that last LMP was 02/18/17. EDD 11/25/16

## 2017-04-19 ENCOUNTER — Inpatient Hospital Stay (HOSPITAL_COMMUNITY)
Admission: AD | Admit: 2017-04-19 | Discharge: 2017-04-20 | Disposition: A | Discharge status: Home / Self Care | Payer: Medicaid Other | Source: Ambulatory Visit | Attending: Obstetrics & Gynecology | Admitting: Obstetrics & Gynecology

## 2017-04-19 ENCOUNTER — Inpatient Hospital Stay (HOSPITAL_COMMUNITY): Payer: Medicaid Other

## 2017-04-19 ENCOUNTER — Encounter (HOSPITAL_COMMUNITY): Payer: Self-pay

## 2017-04-19 DIAGNOSIS — O23591 Infection of other part of genital tract in pregnancy, first trimester: Secondary | ICD-10-CM

## 2017-04-19 DIAGNOSIS — Z3A01 Less than 8 weeks gestation of pregnancy: Secondary | ICD-10-CM | POA: Diagnosis not present

## 2017-04-19 DIAGNOSIS — O98311 Other infections with a predominantly sexual mode of transmission complicating pregnancy, first trimester: Secondary | ICD-10-CM | POA: Insufficient documentation

## 2017-04-19 DIAGNOSIS — B3731 Acute candidiasis of vulva and vagina: Secondary | ICD-10-CM

## 2017-04-19 DIAGNOSIS — Z3A08 8 weeks gestation of pregnancy: Secondary | ICD-10-CM

## 2017-04-19 DIAGNOSIS — O209 Hemorrhage in early pregnancy, unspecified: Secondary | ICD-10-CM | POA: Diagnosis not present

## 2017-04-19 DIAGNOSIS — A5901 Trichomonal vulvovaginitis: Secondary | ICD-10-CM | POA: Diagnosis not present

## 2017-04-19 DIAGNOSIS — B373 Candidiasis of vulva and vagina: Secondary | ICD-10-CM | POA: Insufficient documentation

## 2017-04-19 DIAGNOSIS — O3680X0 Pregnancy with inconclusive fetal viability, not applicable or unspecified: Secondary | ICD-10-CM | POA: Diagnosis not present

## 2017-04-19 DIAGNOSIS — O26841 Uterine size-date discrepancy, first trimester: Secondary | ICD-10-CM

## 2017-04-19 DIAGNOSIS — O208 Other hemorrhage in early pregnancy: Secondary | ICD-10-CM

## 2017-04-19 DIAGNOSIS — O98811 Other maternal infectious and parasitic diseases complicating pregnancy, first trimester: Secondary | ICD-10-CM | POA: Diagnosis not present

## 2017-04-19 LAB — CBC
HEMATOCRIT: 37.8 % (ref 36.0–46.0)
Hemoglobin: 12.9 g/dL (ref 12.0–15.0)
MCH: 26 pg (ref 26.0–34.0)
MCHC: 34.1 g/dL (ref 30.0–36.0)
MCV: 76.2 fL — ABNORMAL LOW (ref 78.0–100.0)
Platelets: 391 10*3/uL (ref 150–400)
RBC: 4.96 MIL/uL (ref 3.87–5.11)
RDW: 15 % (ref 11.5–15.5)
WBC: 14.8 10*3/uL — AB (ref 4.0–10.5)

## 2017-04-19 LAB — URINALYSIS, ROUTINE W REFLEX MICROSCOPIC
Bilirubin Urine: NEGATIVE
Glucose, UA: NEGATIVE mg/dL
Ketones, ur: NEGATIVE mg/dL
Nitrite: NEGATIVE
PROTEIN: NEGATIVE mg/dL
SPECIFIC GRAVITY, URINE: 1.013 (ref 1.005–1.030)
pH: 7 (ref 5.0–8.0)

## 2017-04-19 LAB — WET PREP, GENITAL
CLUE CELLS WET PREP: NONE SEEN
Sperm: NONE SEEN

## 2017-04-19 LAB — HCG, QUANTITATIVE, PREGNANCY: HCG, BETA CHAIN, QUANT, S: 32556 m[IU]/mL — AB (ref <5)

## 2017-04-19 NOTE — MAU Note (Signed)
Pt here with vaginal bleeding the last hour, some cramping. Had an office visit 2 weeks ago. Urine test done, no [place].

## 2017-04-20 DIAGNOSIS — A5901 Trichomonal vulvovaginitis: Secondary | ICD-10-CM | POA: Diagnosis not present

## 2017-04-20 DIAGNOSIS — O23591 Infection of other part of genital tract in pregnancy, first trimester: Secondary | ICD-10-CM

## 2017-04-20 DIAGNOSIS — O208 Other hemorrhage in early pregnancy: Secondary | ICD-10-CM

## 2017-04-20 DIAGNOSIS — Z8619 Personal history of other infectious and parasitic diseases: Secondary | ICD-10-CM

## 2017-04-20 DIAGNOSIS — O3680X0 Pregnancy with inconclusive fetal viability, not applicable or unspecified: Secondary | ICD-10-CM | POA: Diagnosis not present

## 2017-04-20 DIAGNOSIS — B373 Candidiasis of vulva and vagina: Secondary | ICD-10-CM

## 2017-04-20 HISTORY — DX: Personal history of other infectious and parasitic diseases: Z86.19

## 2017-04-20 LAB — GC/CHLAMYDIA PROBE AMP (~~LOC~~) NOT AT ARMC
CHLAMYDIA, DNA PROBE: NEGATIVE
Neisseria Gonorrhea: NEGATIVE

## 2017-04-20 MED ORDER — TERCONAZOLE 0.4 % VA CREA: 1.0000 | TOPICAL_CREAM | Freq: Every day | VAGINAL | 0 refills | Status: DC

## 2017-04-20 MED ORDER — CONCEPT OB 130-92.4-1 MG PO CAPS: 1.0000 | ORAL_CAPSULE | Freq: Every day | ORAL | 12 refills | Status: DC

## 2017-04-20 MED ORDER — METRONIDAZOLE 500 MG PO TABS
2000.0000 mg | ORAL_TABLET | Freq: Once | ORAL | Status: AC
  Administered 2017-04-20: 2000 mg via ORAL
  Filled 2017-04-20: qty 4

## 2017-04-20 NOTE — Discharge Instructions (Signed)
Vaginal Bleeding During Pregnancy, First Trimester °A small amount of bleeding (spotting) from the vagina is relatively common in early pregnancy. It usually stops on its own. Various things may cause bleeding or spotting in early pregnancy. Some bleeding may be related to the pregnancy, and some may not. In most cases, the bleeding is normal and is not a problem. However, bleeding can also be a sign of something serious. Be sure to tell your health care provider about any vaginal bleeding right away. °Some possible causes of vaginal bleeding during the first trimester include: °· Infection or inflammation of the cervix. °· Growths (polyps) on the cervix. °· Miscarriage or threatened miscarriage. °· Pregnancy tissue has developed outside of the uterus and in a fallopian tube (tubal pregnancy). °· Tiny cysts have developed in the uterus instead of pregnancy tissue (molar pregnancy). ° °Follow these instructions at home: °Watch your condition for any changes. The following actions may help to lessen any discomfort you are feeling: °· Follow your health care provider's instructions for limiting your activity. If your health care provider orders bed rest, you may need to stay in bed and only get up to use the bathroom. However, your health care provider may allow you to continue light activity. °· If needed, make plans for someone to help with your regular activities and responsibilities while you are on bed rest. °· Keep track of the number of pads you use each day, how often you change pads, and how soaked (saturated) they are. Write this down. °· Do not use tampons. Do not douche. °· Do not have sexual intercourse or orgasms until approved by your health care provider. °· If you pass any tissue from your vagina, save the tissue so you can show it to your health care provider. °· Only take over-the-counter or prescription medicines as directed by your health care provider. °· Do not take aspirin because it can make  you bleed. °· Keep all follow-up appointments as directed by your health care provider. ° °Contact a health care provider if: °· You have any vaginal bleeding during any part of your pregnancy. °· You have cramps or labor pains. °· You have a fever, not controlled by medicine. °Get help right away if: °· You have severe cramps in your back or belly (abdomen). °· You pass large clots or tissue from your vagina. °· Your bleeding increases. °· You feel light-headed or weak, or you have fainting episodes. °· You have chills. °· You are leaking fluid or have a gush of fluid from your vagina. °· You pass out while having a bowel movement. °This information is not intended to replace advice given to you by your health care provider. Make sure you discuss any questions you have with your health care provider. °Document Released: 05/28/2005 Document Revised: 01/24/2016 Document Reviewed: 04/25/2013 °Elsevier Interactive Patient Education © 2018 [employer] ° ° °Trichomoniasis °Trichomoniasis is an STI (sexually transmitted infection) that can affect both women and men. In women, the outer area of the female genitalia (vulva) and the vagina are affected. In men, the penis is mainly affected, but the prostate and other reproductive organs can also be involved. This condition can be treated with medicine. It often has no symptoms (is asymptomatic), especially in men. °What are the causes? °This condition is caused by an organism called Trichomonas vaginalis. Trichomoniasis most often spreads from person to person (is contagious) through sexual contact. °What increases the risk? °The following factors may make you more likely to   develop this condition: °· Having unprotected sexual intercourse. °· Having sexual intercourse with a partner who has trichomoniasis. °· Having multiple sexual partners. °· Having had previous trichomoniasis infections or other STIs. ° °What are the signs or symptoms? °In women, symptoms of  trichomoniasis include: °· Abnormal vaginal discharge that is clear, white, gray, or yellow-green and foamy and has an unusual "fishy" odor. °· Itching and irritation of the vagina and vulva. °· Burning or pain during urination or sexual intercourse. °· Genital redness and swelling. ° °In men, symptoms of trichomoniasis include: °· Penile discharge that may be foamy or contain pus. °· Pain in the penis. This may happen only when urinating. °· Itching or irritation inside the penis. °· Burning after urination or ejaculation. ° °How is this diagnosed? °In women, this condition may be found during a routine Pap test or physical exam. It may be found in men during a routine physical exam. Your health care provider may perform tests to help diagnose this infection, such as: °· Urine tests (men and women). °· The following in women: °? Testing the pH of the vagina. °? A vaginal swab test that checks for the Trichomonas vaginalis organism. °? Testing vaginal secretions. ° °Your health care provider may test you for other STIs, including HIV (human immunodeficiency virus). °How is this treated? °This condition is treated with medicine taken by mouth (orally), such as metronidazole or tinidazole to fight the infection. Your sexual partner(s) may also need to be tested and treated. °· If you are a woman and you plan to become pregnant or think you may be pregnant, tell your health care provider right away. Some medicines that are used to treat the infection should not be taken during pregnancy. ° °Your health care provider may recommend over-the-counter medicines or creams to help relieve itching or irritation. You may be tested for infection again 3 months after treatment. °Follow these instructions at home: °· Take and use over-the-counter and prescription medicines, including creams, only as told by your health care provider. °· Do not have sexual intercourse until one week after you finish your medicine, or until your  health care provider approves. Ask your health care provider when you may resume sexual intercourse. °· (Women) Do not douche or wear tampons while you have the infection. °· Discuss your infection with your sexual partner(s). Make sure that your partner gets tested and treated, if necessary. °· Keep all follow-up visits as told by your health care provider. This is important. °How is this prevented? °· Use condoms every time you have sex. Using condoms correctly and consistently can help protect against STIs. °· Avoid having multiple sexual partners. °· Talk with your sexual partner about any symptoms that either of you may have, as well as any history of STIs. °· Get tested for STIs and STDs (sexually transmitted diseases) before you have sex. Ask your partner to do the same. °· Do not have sexual contact if you have symptoms of trichomoniasis or another STI. °Contact a health care provider if: °· You still have symptoms after you finish your medicine. °· You develop pain in your abdomen. °· You have pain when you urinate. °· You have bleeding after sexual intercourse. °· You develop a rash. °· You feel nauseous or you vomit. °· You plan to become pregnant or think you may be pregnant. °Summary °· Trichomoniasis is an STI (sexually transmitted infection) that can affect both women and men. °· This condition often has no symptoms (is   asymptomatic), especially in men. °· You should not have sexual intercourse until one week after you finish your medicine, or until your health care provider approves. Ask your health care provider when you may resume sexual intercourse. °· Discuss your infection with your sexual partner. Make sure that your partner gets tested and treated, if necessary. °This information is not intended to replace advice given to you by your health care provider. Make sure you discuss any questions you have with your health care provider. °Document Released: 02/11/2001 Document Revised: 07/11/2016  Document Reviewed: 07/11/2016 °Elsevier Interactive Patient Education © 2017 [employer] Inc. ° °
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
## 2017-04-21 LAB — HIV ANTIBODY (ROUTINE TESTING W REFLEX): HIV SCREEN 4TH GENERATION: NONREACTIVE

## 2017-04-30 ENCOUNTER — Inpatient Hospital Stay (HOSPITAL_COMMUNITY)
Admission: AD | Admit: 2017-04-30 | Discharge: 2017-04-30 | Disposition: A | Discharge status: Home / Self Care | Payer: Medicaid Other | Source: Ambulatory Visit | Attending: Obstetrics and Gynecology | Admitting: Obstetrics and Gynecology

## 2017-04-30 ENCOUNTER — Encounter (HOSPITAL_COMMUNITY): Payer: Self-pay

## 2017-04-30 ENCOUNTER — Inpatient Hospital Stay (HOSPITAL_COMMUNITY): Payer: Medicaid Other

## 2017-04-30 DIAGNOSIS — O99341 Other mental disorders complicating pregnancy, first trimester: Secondary | ICD-10-CM | POA: Insufficient documentation

## 2017-04-30 DIAGNOSIS — Z79899 Other long term (current) drug therapy: Secondary | ICD-10-CM | POA: Insufficient documentation

## 2017-04-30 DIAGNOSIS — F17210 Nicotine dependence, cigarettes, uncomplicated: Secondary | ICD-10-CM | POA: Insufficient documentation

## 2017-04-30 DIAGNOSIS — O021 Missed abortion: Secondary | ICD-10-CM | POA: Diagnosis not present

## 2017-04-30 DIAGNOSIS — O209 Hemorrhage in early pregnancy, unspecified: Secondary | ICD-10-CM

## 2017-04-30 DIAGNOSIS — Z3A01 Less than 8 weeks gestation of pregnancy: Secondary | ICD-10-CM | POA: Insufficient documentation

## 2017-04-30 DIAGNOSIS — O99331 Smoking (tobacco) complicating pregnancy, first trimester: Secondary | ICD-10-CM | POA: Insufficient documentation

## 2017-04-30 DIAGNOSIS — F329 Major depressive disorder, single episode, unspecified: Secondary | ICD-10-CM | POA: Diagnosis not present

## 2017-04-30 HISTORY — DX: Personal history of other infectious and parasitic diseases: Z86.19

## 2017-04-30 LAB — CBC
HEMATOCRIT: 36.5 % (ref 36.0–46.0)
Hemoglobin: 12.4 g/dL (ref 12.0–15.0)
MCH: 25.7 pg — ABNORMAL LOW (ref 26.0–34.0)
MCHC: 34 g/dL (ref 30.0–36.0)
MCV: 75.7 fL — AB (ref 78.0–100.0)
Platelets: 355 10*3/uL (ref 150–400)
RBC: 4.82 MIL/uL (ref 3.87–5.11)
RDW: 15.1 % (ref 11.5–15.5)
WBC: 13.4 10*3/uL — ABNORMAL HIGH (ref 4.0–10.5)

## 2017-04-30 LAB — URINALYSIS, ROUTINE W REFLEX MICROSCOPIC
Bilirubin Urine: NEGATIVE
Glucose, UA: NEGATIVE mg/dL
Ketones, ur: NEGATIVE mg/dL
NITRITE: NEGATIVE
PH: 6 (ref 5.0–8.0)
Protein, ur: NEGATIVE mg/dL
SPECIFIC GRAVITY, URINE: 1.023 (ref 1.005–1.030)

## 2017-04-30 MED ORDER — MISOPROSTOL 200 MCG PO TABS
800.0000 ug | ORAL_TABLET | Freq: Once | ORAL | Status: AC
  Administered 2017-04-30: 800 ug via VAGINAL
  Filled 2017-04-30: qty 4

## 2017-04-30 MED ORDER — OXYCODONE-ACETAMINOPHEN 5-325 MG PO TABS: 1.0000 | ORAL_TABLET | Freq: Four times a day (QID) | ORAL | 0 refills | Status: DC | PRN

## 2017-04-30 MED ORDER — MISOPROSTOL 200 MCG PO TABS: 600.0000 ug | ORAL_TABLET | Freq: Once | ORAL | 0 refills | Status: DC | PRN

## 2017-04-30 MED ORDER — PROMETHAZINE HCL 25 MG PO TABS: 25.0000 mg | ORAL_TABLET | Freq: Four times a day (QID) | ORAL | 0 refills | Status: DC | PRN

## 2017-04-30 NOTE — Discharge Instructions (Signed)
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
## 2017-05-05 ENCOUNTER — Encounter: Payer: Medicaid Other | Admitting: Obstetrics & Gynecology

## 2017-05-05 ENCOUNTER — Ambulatory Visit (HOSPITAL_COMMUNITY): Admission: RE | Admit: 2017-05-05 | Payer: Medicaid Other | Source: Ambulatory Visit

## 2017-05-12 ENCOUNTER — Telehealth: Payer: Self-pay

## 2017-05-12 NOTE — Telephone Encounter (Signed)
Called pt need to reschedule patient appointment. Dr. Jolayne Pantheronstant will not be in office.

## 2017-05-14 ENCOUNTER — Ambulatory Visit: Payer: Medicaid Other | Admitting: Obstetrics and Gynecology

## 2017-09-01 DIAGNOSIS — O24419 Gestational diabetes mellitus in pregnancy, unspecified control: Secondary | ICD-10-CM

## 2017-09-01 HISTORY — DX: Gestational diabetes mellitus in pregnancy, unspecified control: O24.419

## 2017-09-01 NOTE — L&D Delivery Note (Signed)
Patient: Orah Sonnen MRN: [id number]  GBS status: Positive, IAP given PCN   Patient is a 27 y.o. now [id number] s/p NSVD at [redacted]w[redacted]d, who was admitted for PROM. SROM 14h 20m prior to delivery with clear fluid.    Delivery Note At 6:51 PM a viable female was delivered via Vaginal, Spontaneous (Presentation: direct OA).  APGAR: , ; weight pending.   Placenta status: spontaneous, intact.  Cord: 3 vessel with the following complications: none.    Anesthesia:  None  Episiotomy: None Lacerations: None Suture Repair: N/A Est. Blood Loss (mL): 15  Patient delivered spontaneously without assistance, Luna Kitchens, CNM present in the room. Infant with spontaneous cry, placed on mother's abdomen, dried and bulb suctioned. Cord clamped x 2 after 1-minute delay, and cut by RN. Cord blood drawn. This provider arrived after delivery of infant. Placenta delivered spontaneously with gentle cord traction. Fundus firm with massage and Pitocin. Perineum inspected and found to have no lacerations.  Mom to postpartum.  Baby to Couplet care / Skin to Skin.  De Hollingshead 06/10/2018, 7:07 PM

## 2017-10-20 ENCOUNTER — Ambulatory Visit: Payer: Medicaid Other | Admitting: Obstetrics & Gynecology

## 2017-10-27 ENCOUNTER — Ambulatory Visit: Payer: Medicaid Other | Admitting: Obstetrics and Gynecology

## 2017-10-28 ENCOUNTER — Inpatient Hospital Stay (HOSPITAL_COMMUNITY)
Admission: AD | Admit: 2017-10-28 | Discharge: 2017-10-29 | Disposition: A | Discharge status: Home / Self Care | Payer: Medicaid Other | Source: Ambulatory Visit | Attending: Obstetrics & Gynecology | Admitting: Obstetrics & Gynecology

## 2017-10-28 ENCOUNTER — Encounter (HOSPITAL_COMMUNITY): Payer: Self-pay

## 2017-10-28 ENCOUNTER — Inpatient Hospital Stay (HOSPITAL_COMMUNITY): Payer: Medicaid Other

## 2017-10-28 DIAGNOSIS — A5901 Trichomonal vulvovaginitis: Secondary | ICD-10-CM

## 2017-10-28 DIAGNOSIS — O23591 Infection of other part of genital tract in pregnancy, first trimester: Secondary | ICD-10-CM

## 2017-10-28 DIAGNOSIS — Z3A01 Less than 8 weeks gestation of pregnancy: Secondary | ICD-10-CM | POA: Insufficient documentation

## 2017-10-28 DIAGNOSIS — B373 Candidiasis of vulva and vagina: Secondary | ICD-10-CM | POA: Insufficient documentation

## 2017-10-28 DIAGNOSIS — O2242 Hemorrhoids in pregnancy, second trimester: Secondary | ICD-10-CM | POA: Insufficient documentation

## 2017-10-28 DIAGNOSIS — O98811 Other maternal infectious and parasitic diseases complicating pregnancy, first trimester: Secondary | ICD-10-CM | POA: Diagnosis not present

## 2017-10-28 DIAGNOSIS — Z8619 Personal history of other infectious and parasitic diseases: Secondary | ICD-10-CM | POA: Insufficient documentation

## 2017-10-28 DIAGNOSIS — O26891 Other specified pregnancy related conditions, first trimester: Secondary | ICD-10-CM | POA: Diagnosis not present

## 2017-10-28 DIAGNOSIS — F17210 Nicotine dependence, cigarettes, uncomplicated: Secondary | ICD-10-CM | POA: Insufficient documentation

## 2017-10-28 DIAGNOSIS — B9689 Other specified bacterial agents as the cause of diseases classified elsewhere: Secondary | ICD-10-CM | POA: Diagnosis not present

## 2017-10-28 DIAGNOSIS — R109 Unspecified abdominal pain: Secondary | ICD-10-CM | POA: Diagnosis not present

## 2017-10-28 DIAGNOSIS — O99321 Drug use complicating pregnancy, first trimester: Secondary | ICD-10-CM | POA: Insufficient documentation

## 2017-10-28 DIAGNOSIS — O3680X0 Pregnancy with inconclusive fetal viability, not applicable or unspecified: Secondary | ICD-10-CM | POA: Diagnosis not present

## 2017-10-28 DIAGNOSIS — K648 Other hemorrhoids: Secondary | ICD-10-CM | POA: Diagnosis not present

## 2017-10-28 DIAGNOSIS — B3731 Acute candidiasis of vulva and vagina: Secondary | ICD-10-CM

## 2017-10-28 DIAGNOSIS — Z9889 Other specified postprocedural states: Secondary | ICD-10-CM | POA: Diagnosis not present

## 2017-10-28 DIAGNOSIS — O99331 Smoking (tobacco) complicating pregnancy, first trimester: Secondary | ICD-10-CM | POA: Insufficient documentation

## 2017-10-28 DIAGNOSIS — O208 Other hemorrhage in early pregnancy: Secondary | ICD-10-CM

## 2017-10-28 DIAGNOSIS — F1210 Cannabis abuse, uncomplicated: Secondary | ICD-10-CM | POA: Insufficient documentation

## 2017-10-28 DIAGNOSIS — K921 Melena: Secondary | ICD-10-CM | POA: Diagnosis present

## 2017-10-28 DIAGNOSIS — O209 Hemorrhage in early pregnancy, unspecified: Secondary | ICD-10-CM

## 2017-10-28 DIAGNOSIS — Z3201 Encounter for pregnancy test, result positive: Secondary | ICD-10-CM | POA: Insufficient documentation

## 2017-10-28 HISTORY — DX: Personal history of other infectious and parasitic diseases: Z86.19

## 2017-10-28 LAB — URINALYSIS, ROUTINE W REFLEX MICROSCOPIC
Bilirubin Urine: NEGATIVE
Glucose, UA: NEGATIVE mg/dL
Ketones, ur: NEGATIVE mg/dL
Nitrite: NEGATIVE
Protein, ur: NEGATIVE mg/dL
Specific Gravity, Urine: 1.026 (ref 1.005–1.030)
pH: 6 (ref 5.0–8.0)

## 2017-10-28 LAB — CBC
HEMATOCRIT: 40.2 % (ref 36.0–46.0)
HEMOGLOBIN: 13.5 g/dL (ref 12.0–15.0)
MCH: 25.5 pg — ABNORMAL LOW (ref 26.0–34.0)
MCHC: 33.6 g/dL (ref 30.0–36.0)
MCV: 76 fL — ABNORMAL LOW (ref 78.0–100.0)
Platelets: 348 10*3/uL (ref 150–400)
RBC: 5.29 MIL/uL — ABNORMAL HIGH (ref 3.87–5.11)
RDW: 14.4 % (ref 11.5–15.5)
WBC: 12.6 10*3/uL — ABNORMAL HIGH (ref 4.0–10.5)

## 2017-10-28 LAB — WET PREP, GENITAL
SPERM: NONE SEEN
TRICH WET PREP: NONE SEEN

## 2017-10-28 LAB — POCT PREGNANCY, URINE: PREG TEST UR: POSITIVE — AB

## 2017-10-28 LAB — HCG, QUANTITATIVE, PREGNANCY: HCG, BETA CHAIN, QUANT, S: 684 m[IU]/mL — AB (ref <5)

## 2017-10-28 NOTE — MAU Note (Addendum)
Pt reports when she has a bowel movement she has blood in stool. Pt reports some lower abdominal cramping on right side that started this week. Only hurts when coughing. Pt has not taken anything for it. States she took plan B on 10/13/2017. Pt states LMP 09/27/2017. States she has not taken a pregnancy test at home.
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
## 2017-10-29 ENCOUNTER — Encounter: Payer: Self-pay | Admitting: Certified Nurse Midwife

## 2017-10-29 DIAGNOSIS — B373 Candidiasis of vulva and vagina: Secondary | ICD-10-CM | POA: Diagnosis not present

## 2017-10-29 DIAGNOSIS — K648 Other hemorrhoids: Secondary | ICD-10-CM

## 2017-10-29 DIAGNOSIS — O26891 Other specified pregnancy related conditions, first trimester: Secondary | ICD-10-CM

## 2017-10-29 DIAGNOSIS — R109 Unspecified abdominal pain: Secondary | ICD-10-CM | POA: Diagnosis not present

## 2017-10-29 LAB — HIV ANTIBODY (ROUTINE TESTING W REFLEX): HIV Screen 4th Generation wRfx: NONREACTIVE

## 2017-10-29 LAB — GC/CHLAMYDIA PROBE AMP (~~LOC~~) NOT AT ARMC
Chlamydia: NEGATIVE
NEISSERIA GONORRHEA: POSITIVE — AB

## 2017-10-29 MED ORDER — DOCUSATE SODIUM 100 MG PO CAPS: 100.0000 mg | ORAL_CAPSULE | Freq: Two times a day (BID) | ORAL | 2 refills | Status: DC | PRN

## 2017-10-29 MED ORDER — TERCONAZOLE 0.4 % VA CREA: 1.0000 | TOPICAL_CREAM | Freq: Every day | VAGINAL | 1 refills | Status: DC

## 2017-10-30 ENCOUNTER — Encounter: Payer: Self-pay | Admitting: Advanced Practice Midwife

## 2017-10-30 DIAGNOSIS — O3680X0 Pregnancy with inconclusive fetal viability, not applicable or unspecified: Secondary | ICD-10-CM | POA: Insufficient documentation

## 2017-10-31 ENCOUNTER — Inpatient Hospital Stay (HOSPITAL_COMMUNITY)
Admission: AD | Admit: 2017-10-31 | Discharge: 2017-10-31 | Disposition: A | Discharge status: Home / Self Care | Payer: Medicaid Other | Source: Ambulatory Visit | Attending: Obstetrics and Gynecology | Admitting: Obstetrics and Gynecology

## 2017-10-31 ENCOUNTER — Other Ambulatory Visit: Payer: Self-pay

## 2017-10-31 ENCOUNTER — Encounter (HOSPITAL_COMMUNITY): Payer: Self-pay | Admitting: Student

## 2017-10-31 DIAGNOSIS — O98211 Gonorrhea complicating pregnancy, first trimester: Secondary | ICD-10-CM

## 2017-10-31 DIAGNOSIS — Z3A01 Less than 8 weeks gestation of pregnancy: Secondary | ICD-10-CM | POA: Diagnosis not present

## 2017-10-31 DIAGNOSIS — O3680X0 Pregnancy with inconclusive fetal viability, not applicable or unspecified: Secondary | ICD-10-CM

## 2017-10-31 HISTORY — DX: Personal history of other infectious and parasitic diseases: Z86.19

## 2017-10-31 LAB — HCG, QUANTITATIVE, PREGNANCY: HCG, BETA CHAIN, QUANT, S: 2296 m[IU]/mL — AB (ref <5)

## 2017-10-31 MED ORDER — AZITHROMYCIN 250 MG PO TABS
1000.0000 mg | ORAL_TABLET | Freq: Once | ORAL | Status: AC
  Administered 2017-10-31: 1000 mg via ORAL
  Filled 2017-10-31: qty 4

## 2017-10-31 MED ORDER — CEFTRIAXONE SODIUM 250 MG IJ SOLR
250.0000 mg | Freq: Once | INTRAMUSCULAR | Status: AC
  Administered 2017-10-31: 250 mg via INTRAMUSCULAR
  Filled 2017-10-31: qty 250

## 2017-10-31 NOTE — MAU Note (Signed)
Little pain last night, none no.  Bleeding.  Doing ok, just tired.
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
## 2017-10-31 NOTE — Discharge Instructions (Signed)
Return to care   If you have heavier bleeding that soaks through more that 2 pads per hour for an hour or more  If you bleed so much that you feel like you might pass out or you do pass out  If you have significant abdominal pain that is not improved with Tylenol   If you develop a fever > 100.5    Gonorrhea Gonorrhea is a sexually transmitted disease (STD) that can affect both men and women. If left untreated, this infection can:  Damage the female or female organs.  Cause women and men to be unable to have children (be sterile).  Harm a fetus if an infected woman is pregnant.  It is important to get treatment for gonorrhea as soon as possible. It is also necessary for all of your sexual partners to be tested for the infection. What are the causes? This condition is caused by bacteria called Neisseria gonorrhoeae. The infection is spread from person to person through sexual contact, including oral, anal, and vaginal sex. A newborn can contract the infection from his or her mother during birth. What increases the risk? The following factors may make you more likely to develop this condition:  Being a woman who is younger than 28 years of age and who is sexually active.  Being a woman 42 years of age or older who has: ? A new sex partner. ? More than one sex partner. ? A sex partner who has an STD.  Being a man who has: ? A new sex partner. ? More than one sex partner. ? A sex partner who has an STD.  Using condoms inconsistently.  Currently having, or having previously had, an STD.  Exchanging sex for money or drugs.  What are the signs or symptoms? Some people do not have any symptoms. If you do have symptoms, they may be different for females and males. For females  Pain in the lower abdomen.  Abnormal vaginal discharge. The discharge may be cloudy, thick, or yellow-green in color.  Bleeding between periods.  Painful sex.  Burning or itching in and around the  vagina.  Pain or burning when urinating.  Irritation, pain, bleeding, or discharge from the rectum. This may occur if the infection was spread by anal sex.  Sore throat or swollen lymph nodes in the neck. This may occur if the infection was spread by oral sex. For males  Abnormal discharge from the penis. This discharge may be cloudy, thick, or yellow-green in color.  Pain or burning during urination.  Pain or swelling in the testicles.  Irritation, pain, bleeding, or discharge from the rectum. This may occur if the infection was spread by anal sex.  Sore throat, fever, or swollen lymph nodes in the neck. This may occur if the infection was spread by oral sex. How is this diagnosed? This condition is diagnosed based on:  A physical exam.  A sample of discharge that is examined under a microscope to look for the bacteria. The discharge may be taken from the urethra, cervix, throat, or rectum.  Urine tests.  Not all of test results will be available during your visit. How is this treated? This condition is treated with antibiotic medicines. It is important for treatment to begin as soon as possible. Early treatment may prevent some problems from developing. Do not have sex during treatment. Avoid all types of sexual activity for 7 days after treatment is complete and until any sex partners have been treated.  Follow these instructions at home:  Take over-the-counter and prescription medicines only as told by your health care provider.  Take your antibiotic medicine as told by your health care provider. Do not stop taking the antibiotic even if you start to feel better.  Do not have sex until at least 7 days after you and your partner(s) have finished treatment and your health care provider says it is okay.  It is your responsibility to get your test results. Ask your health care provider, or the department performing the test, when your results will be ready.  If you test  positive for gonorrhea, inform your recent sexual partners. This includes any oral, anal, or vaginal sex partners. They need to be checked for gonorrhea even if they do not have symptoms. They may need treatment, even if they test negative for gonorrhea.  Keep all follow-up visits as told by your health care provider. This is important. How is this prevented?  Use latex condoms correctly every time you have sexual intercourse.  Ask if your sexual partner has been tested for STDs and had negative results.  Avoid having multiple sexual partners. Contact a health care provider if:  You develop a bad reaction to the medicine you were prescribed. This may include: ? A rash. ? Nausea. ? Vomiting. ? Diarrhea.  Your symptoms do not get better after a few days of taking antibiotics.  Your symptoms get worse.  You develop new symptoms.  Your pain gets worse.  You have a fever.  You develop pain, itching, or discharge around the eyes. Get help right away if:  You feel dizzy or faint.  You have trouble breathing or have shortness of breath.  You develop an irregular heartbeat.  You have severe abdominal pain with or without shoulder pain.  You develop any bumps or sores (lesions) on your skin.  You develop warmth, redness, pain, or swelling around your joints, such as the knee. Summary  Gonorrhea is an STDthat can affect both men and women.  This condition is caused by bacteria called Neisseria gonorrhoeae. The infection is spread from person to person, usually through sexual contact, including oral, anal, and vaginal sex.  Symptoms vary between males and females. Generally, they include abnormal discharge and burning during urination. Women may also experience painful sex, itching around the vagina, and bleeding between menstrual periods. Men may also experience swelling of the testicles.  This condition is treated with antibiotic medicines. Do not have sex until at least 7  days after completing antibiotic treatment.  If left untreated, gonorrhea can have serious side effects and complications. This information is not intended to replace advice given to you by your health care provider. Make sure you discuss any questions you have with your health care provider. Document Released: 08/15/2000 Document Revised: 07/18/2016 Document Reviewed: 07/18/2016 Elsevier Interactive Patient Education  [employer]2018 [employer]

## 2017-11-03 ENCOUNTER — Ambulatory Visit: Payer: Medicaid Other | Admitting: Obstetrics & Gynecology

## 2017-11-16 ENCOUNTER — Encounter: Payer: Self-pay | Admitting: Obstetrics and Gynecology

## 2017-11-16 ENCOUNTER — Ambulatory Visit: Payer: Medicaid Other | Admitting: *Deleted

## 2017-11-16 ENCOUNTER — Ambulatory Visit (HOSPITAL_COMMUNITY)
Admission: RE | Admit: 2017-11-16 | Discharge: 2017-11-16 | Disposition: A | Discharge status: Home / Self Care | Payer: Medicaid Other | Source: Ambulatory Visit | Attending: Student | Admitting: Student

## 2017-11-16 DIAGNOSIS — N854 Malposition of uterus: Secondary | ICD-10-CM | POA: Diagnosis not present

## 2017-11-16 DIAGNOSIS — O34531 Maternal care for retroversion of gravid uterus, first trimester: Secondary | ICD-10-CM | POA: Diagnosis not present

## 2017-11-16 DIAGNOSIS — R188 Other ascites: Secondary | ICD-10-CM | POA: Diagnosis not present

## 2017-11-16 DIAGNOSIS — O09891 Supervision of other high risk pregnancies, first trimester: Secondary | ICD-10-CM | POA: Insufficient documentation

## 2017-11-16 DIAGNOSIS — O3680X0 Pregnancy with inconclusive fetal viability, not applicable or unspecified: Secondary | ICD-10-CM

## 2017-11-16 DIAGNOSIS — Z3A01 Less than 8 weeks gestation of pregnancy: Secondary | ICD-10-CM | POA: Diagnosis not present

## 2017-11-16 DIAGNOSIS — Z3490 Encounter for supervision of normal pregnancy, unspecified, unspecified trimester: Secondary | ICD-10-CM | POA: Diagnosis present

## 2017-11-16 DIAGNOSIS — Z712 Person consulting for explanation of examination or test findings: Secondary | ICD-10-CM

## 2017-11-16 NOTE — Progress Notes (Signed)
US results reviewed by Dr. Vergie LivingPickens.  Pt informed of viable pregnancy.  She plans prenatal care @  [facility] office. Pt voiced understanding and had no questions.

## 2017-11-17 NOTE — Progress Notes (Signed)
I have reviewed the chart and agree with nursing staff's documentation of this patient's encounter.  Tuyen Uncapher, MD 11/17/2017 10:11 AM    

## 2017-12-15 ENCOUNTER — Encounter: Payer: Medicaid Other | Admitting: Obstetrics & Gynecology

## 2017-12-15 DIAGNOSIS — O0990 Supervision of high risk pregnancy, unspecified, unspecified trimester: Secondary | ICD-10-CM | POA: Insufficient documentation

## 2017-12-23 ENCOUNTER — Encounter: Payer: Medicaid Other | Admitting: Certified Nurse Midwife

## 2017-12-26 ENCOUNTER — Inpatient Hospital Stay (HOSPITAL_COMMUNITY)
Admission: AD | Admit: 2017-12-26 | Discharge: 2017-12-27 | Disposition: A | Discharge status: Home / Self Care | Payer: Medicaid Other | Source: Ambulatory Visit | Attending: Obstetrics and Gynecology | Admitting: Obstetrics and Gynecology

## 2017-12-26 DIAGNOSIS — O99331 Smoking (tobacco) complicating pregnancy, first trimester: Secondary | ICD-10-CM | POA: Insufficient documentation

## 2017-12-26 DIAGNOSIS — Z3A13 13 weeks gestation of pregnancy: Secondary | ICD-10-CM | POA: Insufficient documentation

## 2017-12-26 DIAGNOSIS — N760 Acute vaginitis: Secondary | ICD-10-CM

## 2017-12-26 DIAGNOSIS — O23591 Infection of other part of genital tract in pregnancy, first trimester: Secondary | ICD-10-CM | POA: Insufficient documentation

## 2017-12-26 DIAGNOSIS — B9689 Other specified bacterial agents as the cause of diseases classified elsewhere: Secondary | ICD-10-CM | POA: Insufficient documentation

## 2017-12-26 DIAGNOSIS — O26892 Other specified pregnancy related conditions, second trimester: Secondary | ICD-10-CM

## 2017-12-26 DIAGNOSIS — R109 Unspecified abdominal pain: Secondary | ICD-10-CM

## 2017-12-26 DIAGNOSIS — F17210 Nicotine dependence, cigarettes, uncomplicated: Secondary | ICD-10-CM | POA: Insufficient documentation

## 2017-12-27 ENCOUNTER — Encounter (HOSPITAL_COMMUNITY): Payer: Self-pay | Admitting: *Deleted

## 2017-12-27 DIAGNOSIS — O26892 Other specified pregnancy related conditions, second trimester: Secondary | ICD-10-CM | POA: Diagnosis not present

## 2017-12-27 DIAGNOSIS — N760 Acute vaginitis: Secondary | ICD-10-CM | POA: Diagnosis not present

## 2017-12-27 DIAGNOSIS — B9689 Other specified bacterial agents as the cause of diseases classified elsewhere: Secondary | ICD-10-CM | POA: Diagnosis not present

## 2017-12-27 DIAGNOSIS — O23591 Infection of other part of genital tract in pregnancy, first trimester: Secondary | ICD-10-CM | POA: Diagnosis not present

## 2017-12-27 DIAGNOSIS — O99331 Smoking (tobacco) complicating pregnancy, first trimester: Secondary | ICD-10-CM | POA: Diagnosis not present

## 2017-12-27 DIAGNOSIS — R109 Unspecified abdominal pain: Secondary | ICD-10-CM

## 2017-12-27 DIAGNOSIS — Z3A13 13 weeks gestation of pregnancy: Secondary | ICD-10-CM

## 2017-12-27 DIAGNOSIS — F17210 Nicotine dependence, cigarettes, uncomplicated: Secondary | ICD-10-CM | POA: Diagnosis not present

## 2017-12-27 LAB — URINALYSIS, ROUTINE W REFLEX MICROSCOPIC
Bilirubin Urine: NEGATIVE
Glucose, UA: NEGATIVE mg/dL
Ketones, ur: NEGATIVE mg/dL
Nitrite: NEGATIVE
Protein, ur: NEGATIVE mg/dL
Specific Gravity, Urine: 1.021 (ref 1.005–1.030)
pH: 5 (ref 5.0–8.0)

## 2017-12-27 LAB — WET PREP, GENITAL
Sperm: NONE SEEN
Trich, Wet Prep: NONE SEEN
Yeast Wet Prep HPF POC: NONE SEEN

## 2017-12-27 MED ORDER — METRONIDAZOLE 500 MG PO TABS: 500.0000 mg | ORAL_TABLET | Freq: Two times a day (BID) | ORAL | 0 refills | Status: DC

## 2017-12-27 MED ORDER — ACETAMINOPHEN 500 MG PO TABS
1000.0000 mg | ORAL_TABLET | Freq: Once | ORAL | Status: AC
  Administered 2017-12-27: 1000 mg via ORAL
  Filled 2017-12-27: qty 2
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
## 2017-12-27 NOTE — MAU Note (Signed)
I have been having bad cramps for couple days. Denies vag bleeding. Some clear d/c couple days ago.

## 2017-12-28 LAB — GC/CHLAMYDIA PROBE AMP (~~LOC~~) NOT AT ARMC
Chlamydia: NEGATIVE
Neisseria Gonorrhea: NEGATIVE

## 2017-12-29 ENCOUNTER — Encounter: Payer: Medicaid Other | Admitting: Obstetrics & Gynecology

## 2018-01-19 ENCOUNTER — Other Ambulatory Visit (HOSPITAL_COMMUNITY)
Admission: RE | Admit: 2018-01-19 | Discharge: 2018-01-19 | Disposition: A | Discharge status: Home / Self Care | Payer: Medicaid Other | Source: Ambulatory Visit | Attending: Obstetrics & Gynecology | Admitting: Obstetrics & Gynecology

## 2018-01-19 ENCOUNTER — Encounter: Payer: Self-pay | Admitting: Obstetrics and Gynecology

## 2018-01-19 ENCOUNTER — Ambulatory Visit (INDEPENDENT_AMBULATORY_CARE_PROVIDER_SITE_OTHER): Payer: Medicaid Other | Admitting: Obstetrics and Gynecology

## 2018-01-19 DIAGNOSIS — D069 Carcinoma in situ of cervix, unspecified: Secondary | ICD-10-CM | POA: Insufficient documentation

## 2018-01-19 DIAGNOSIS — Z3A00 Weeks of gestation of pregnancy not specified: Secondary | ICD-10-CM | POA: Diagnosis not present

## 2018-01-19 DIAGNOSIS — O9A119 Malignant neoplasm complicating pregnancy, unspecified trimester: Secondary | ICD-10-CM | POA: Insufficient documentation

## 2018-01-19 DIAGNOSIS — Z3480 Encounter for supervision of other normal pregnancy, unspecified trimester: Secondary | ICD-10-CM | POA: Diagnosis present

## 2018-01-19 DIAGNOSIS — A549 Gonococcal infection, unspecified: Secondary | ICD-10-CM

## 2018-01-19 DIAGNOSIS — Z98891 History of uterine scar from previous surgery: Secondary | ICD-10-CM

## 2018-01-19 DIAGNOSIS — Z3482 Encounter for supervision of other normal pregnancy, second trimester: Secondary | ICD-10-CM | POA: Diagnosis not present

## 2018-01-19 DIAGNOSIS — O34219 Maternal care for unspecified type scar from previous cesarean delivery: Secondary | ICD-10-CM

## 2018-01-19 NOTE — Patient Instructions (Signed)

## 2018-01-19 NOTE — Progress Notes (Signed)
Subjective:  Jennifer Cohen is a 27 y.o. [phone] at [redacted]w[redacted]d being seen today for her first OB visit. EDD by certain LMP and confirmed by first trimester U/S. No chronic medical problems or medications.   She is currently monitored for the following issues for this low-risk pregnancy and has Supervision of other normal pregnancy, antepartum; Gonorrhea; History of cesarean section; and History of VBAC on their problem list.  Patient reports no complaints.  Contractions: Irritability. Vag. Bleeding: None.  Movement: Present. Denies leaking of fluid.   The following portions of the patient's history were reviewed and updated as appropriate: allergies, current medications, past family history, past medical history, past social history, past surgical history and problem list. Problem list updated.  Objective:   Vitals:   01/19/18 1040  BP: 105/71  Pulse: (!) 101  Weight: 198 lb 12.8 oz (90.2 kg)    Fetal Status:     Movement: Present     General:  Alert, oriented and cooperative. Patient is in no acute distress.  Skin: Skin is warm and dry. No rash noted.   Cardiovascular: Normal heart rate noted  Respiratory: Normal respiratory effort, no problems with respiration noted  Abdomen: Soft, gravid, appropriate for gestational age. Pain/Pressure: Absent     Pelvic:  Cervical exam performed        Extremities: Normal range of motion.  Edema: None  Mental Status: Normal mood and affect. Normal behavior. Normal judgment and thought content.   Urinalysis:      Assessment and Plan:  Pregnancy: [id number] at [redacted]w[redacted]d  1. Supervision of other normal pregnancy, antepartum Prenatal care and labs reviewed with pt. PNV qd - Genetic Screening - Culture, OB Urine - Cystic Fibrosis Mutation 97 - Hemoglobinopathy evaluation - Obstetric Panel, Including HIV - [place] MFM OB COMP + 14 WK; Future - Cytology - PAP - Hemoglobin A1c  2. Gonorrhea TOC today  3. History of cesarean section   4. History of  VBAC Will need to sign consent at later OBV  Preterm labor symptoms and general obstetric precautions including but not limited to vaginal bleeding, contractions, leaking of fluid and fetal movement were reviewed in detail with the patient. Please refer to After Visit Summary for other counseling recommendations.  Return in about 1 month (around 02/16/2018) for OB visit.   Hermina Staggers, MD

## 2018-01-20 LAB — HEMOGLOBIN A1C
ESTIMATED AVERAGE GLUCOSE: 103 mg/dL
Hgb A1c MFr Bld: 5.2 % (ref 4.8–5.6)

## 2018-01-21 LAB — CYTOLOGY - PAP
Bacterial vaginitis: NEGATIVE
CANDIDA VAGINITIS: NEGATIVE
Chlamydia: NEGATIVE
Diagnosis: HIGH — AB
Neisseria Gonorrhea: NEGATIVE
Trichomonas: NEGATIVE

## 2018-01-22 LAB — OBSTETRIC PANEL, INCLUDING HIV
Antibody Screen: NEGATIVE
BASOS ABS: 0.1 10*3/uL (ref 0.0–0.2)
Basos: 0 %
EOS (ABSOLUTE): 0.6 10*3/uL — ABNORMAL HIGH (ref 0.0–0.4)
EOS: 4 %
HEMATOCRIT: 37.5 % (ref 34.0–46.6)
HEMOGLOBIN: 11.9 g/dL (ref 11.1–15.9)
HEP B S AG: NEGATIVE
HIV SCREEN 4TH GENERATION: NONREACTIVE
IMMATURE GRANULOCYTES: 2 %
Immature Grans (Abs): 0.2 10*3/uL — ABNORMAL HIGH (ref 0.0–0.1)
LYMPHS ABS: 2.3 10*3/uL (ref 0.7–3.1)
Lymphs: 17 %
MCH: 25.3 pg — AB (ref 26.6–33.0)
MCHC: 31.7 g/dL (ref 31.5–35.7)
MCV: 80 fL (ref 79–97)
MONOCYTES: 5 %
Monocytes Absolute: 0.6 10*3/uL (ref 0.1–0.9)
NEUTROS ABS: 9.6 10*3/uL — AB (ref 1.4–7.0)
Neutrophils: 72 %
PLATELETS: 398 10*3/uL (ref 150–450)
RBC: 4.7 x10E6/uL (ref 3.77–5.28)
RDW: 15.7 % — ABNORMAL HIGH (ref 12.3–15.4)
RH TYPE: POSITIVE
RPR: NONREACTIVE
RUBELLA: 1.13 {index} (ref >0.99)
WBC: 13.3 10*3/uL — AB (ref 3.4–10.8)

## 2018-01-22 LAB — HEMOGLOBINOPATHY EVALUATION
HEMOGLOBIN F QUANTITATION: 2.2 % — AB (ref 0.0–2.0)
HGB C: 0 %
HGB S: 0 %
HGB VARIANT: 25 % — ABNORMAL HIGH
Hemoglobin A2 Quantitation: 3.8 % — ABNORMAL HIGH (ref 1.8–3.2)
Hgb A: 69 % — ABNORMAL LOW (ref 96.4–98.8)

## 2018-01-22 LAB — CULTURE, OB URINE

## 2018-01-22 LAB — URINE CULTURE, OB REFLEX

## 2018-01-26 ENCOUNTER — Encounter: Payer: Self-pay | Admitting: Obstetrics and Gynecology

## 2018-01-27 LAB — CYSTIC FIBROSIS MUTATION 97: Interpretation: NOT DETECTED

## 2018-02-15 ENCOUNTER — Encounter (HOSPITAL_COMMUNITY): Payer: Self-pay

## 2018-02-16 ENCOUNTER — Encounter: Payer: Medicaid Other | Admitting: Obstetrics and Gynecology

## 2018-02-23 ENCOUNTER — Other Ambulatory Visit: Payer: Self-pay | Admitting: Obstetrics and Gynecology

## 2018-02-23 ENCOUNTER — Ambulatory Visit (HOSPITAL_COMMUNITY)
Admission: RE | Admit: 2018-02-23 | Discharge: 2018-02-23 | Disposition: A | Discharge status: Home / Self Care | Payer: Medicaid Other | Source: Ambulatory Visit | Attending: Obstetrics and Gynecology | Admitting: Obstetrics and Gynecology

## 2018-02-23 DIAGNOSIS — Z3A21 21 weeks gestation of pregnancy: Secondary | ICD-10-CM | POA: Insufficient documentation

## 2018-02-23 DIAGNOSIS — O99212 Obesity complicating pregnancy, second trimester: Secondary | ICD-10-CM | POA: Insufficient documentation

## 2018-02-23 DIAGNOSIS — Z363 Encounter for antenatal screening for malformations: Secondary | ICD-10-CM

## 2018-02-23 DIAGNOSIS — Z3480 Encounter for supervision of other normal pregnancy, unspecified trimester: Secondary | ICD-10-CM

## 2018-02-25 ENCOUNTER — Telehealth: Payer: Self-pay

## 2018-02-25 DIAGNOSIS — Z3480 Encounter for supervision of other normal pregnancy, unspecified trimester: Secondary | ICD-10-CM

## 2018-02-25 NOTE — Telephone Encounter (Signed)
Returned call, no answer, left vm 

## 2018-03-02 ENCOUNTER — Encounter: Payer: Medicaid Other | Admitting: Obstetrics and Gynecology

## 2018-03-10 ENCOUNTER — Encounter: Payer: Medicaid Other | Admitting: Obstetrics and Gynecology

## 2018-03-16 ENCOUNTER — Encounter: Payer: Self-pay | Admitting: Obstetrics and Gynecology

## 2018-03-16 ENCOUNTER — Ambulatory Visit (INDEPENDENT_AMBULATORY_CARE_PROVIDER_SITE_OTHER): Payer: Medicaid Other | Admitting: Obstetrics and Gynecology

## 2018-03-16 VITALS — BP 114/75 | HR 88 | Wt 202.0 lb

## 2018-03-16 DIAGNOSIS — O99619 Diseases of the digestive system complicating pregnancy, unspecified trimester: Secondary | ICD-10-CM

## 2018-03-16 DIAGNOSIS — O99612 Diseases of the digestive system complicating pregnancy, second trimester: Secondary | ICD-10-CM

## 2018-03-16 DIAGNOSIS — Z98891 History of uterine scar from previous surgery: Secondary | ICD-10-CM

## 2018-03-16 DIAGNOSIS — Z3482 Encounter for supervision of other normal pregnancy, second trimester: Secondary | ICD-10-CM

## 2018-03-16 DIAGNOSIS — R87613 High grade squamous intraepithelial lesion on cytologic smear of cervix (HGSIL): Secondary | ICD-10-CM | POA: Diagnosis not present

## 2018-03-16 DIAGNOSIS — K5900 Constipation, unspecified: Secondary | ICD-10-CM | POA: Insufficient documentation

## 2018-03-16 DIAGNOSIS — Z3480 Encounter for supervision of other normal pregnancy, unspecified trimester: Secondary | ICD-10-CM

## 2018-03-16 MED ORDER — SENNOSIDES-DOCUSATE SODIUM 8.6-50 MG PO TABS: 2.0000 | ORAL_TABLET | Freq: Every day | ORAL | 3 refills | Status: DC | PRN

## 2018-03-16 NOTE — Progress Notes (Signed)
Subjective:  Jennifer Cohen is a 27 y.o. [phone] at 5260w2d being seen today for ongoing prenatal care.  She is currently monitored for the following issues for this high-risk pregnancy and has Supervision of other normal pregnancy, antepartum; History of cesarean section; History of VBAC; HGSIL (high grade squamous intraepithelial lesion) on Pap smear of cervix; and Constipation during pregnancy on their problem list.  Patient reports constipation.  Contractions: Not present. Vag. Bleeding: None.  Movement: Present. Denies leaking of fluid.   The following portions of the patient's history were reviewed and updated as appropriate: allergies, current medications, past family history, past medical history, past social history, past surgical history and problem list. Problem list updated.  Objective:   Vitals:   03/16/18 0930  BP: 114/75  Pulse: 88  Weight: 91.6 kg (202 lb)    Fetal Status: Fetal Heart Rate (bpm): 150   Movement: Present     General:  Alert, oriented and cooperative. Patient is in no acute distress.  Skin: Skin is warm and dry. No rash noted.   Cardiovascular: Normal heart rate noted  Respiratory: Normal respiratory effort, no problems with respiration noted  Abdomen: Soft, gravid, appropriate for gestational age. Pain/Pressure: Absent     Pelvic:  Cervical exam deferred        Extremities: Normal range of motion.     Mental Status: Normal mood and affect. Normal behavior. Normal judgment and thought content.   Urinalysis:      Assessment and Plan:  Pregnancy: [id number] at 5460w2d  1. Supervision of other normal pregnancy, antepartum Stable Glucola next visit  2. History of VBAC Desires TOLAC  3. History of cesarean section See above  4. HGSIL (high grade squamous intraepithelial lesion) on Pap smear of cervix S/P colpo today No BX, will need repeat pap smear PP  5. Constipation during pregnancy in second trimester  - senna-docusate (SENOKOT-S) 8.6-50 MG  tablet; Take 2 tablets by mouth daily as needed for mild constipation.  Dispense: 30 tablet; Refill: 3  Preterm labor symptoms and general obstetric precautions including but not limited to vaginal bleeding, contractions, leaking of fluid and fetal movement were reviewed in detail with the patient. Please refer to After Visit Summary for other counseling recommendations.  Return for OB visit.   Hermina StaggersErvin, Simone Tuckey L, MD

## 2018-03-16 NOTE — Progress Notes (Signed)
    GYNECOLOGY CLINIC COLPOSCOPY PROCEDURE NOTE  27 y.o. [id number] here for colposcopy for high-grade squamous intraepithelial neoplasia  (HGSIL-encompassing moderate and severe dysplasia) pap smear on 5/19. Discussed role for HPV in cervical dysplasia, need for surveillance.  Patient given informed consent, signed copy in the chart, time out was performed.  Placed in lithotomy position. Cervix viewed with speculum and colposcope after application of acetic acid.   Colposcopy adequate? Yes  acetowhite lesion(s) noted at 12 and 6 o'clock;   No Bx obtained d/t pregnancy.   Patient was given post procedure instructions.  Colposcopy findings correlate with pap smear findings. Will repeat pap smear at postpartum visit.   Hermina StaggersMichael L Ervin, MD, FACOG Attending Obstetrician & Gynecologist [facility]

## 2018-03-16 NOTE — Patient Instructions (Signed)
Colposcopy, Care After This sheet gives you information about how to care for yourself after your procedure. Your doctor may also give you more specific instructions. If you have problems or questions, contact your doctor. What can I expect after the procedure? If you did not have a tissue sample removed (did not have a biopsy), you may only have some spotting for a few days. You can go back to your normal activities. If you had a tissue sample removed, it is common to have:  Soreness and pain. This may last for a few days.  Light-headedness.  Mild bleeding from your vagina or dark-colored, grainy discharge from your vagina. This may last for a few days. You may need to wear a sanitary pad.  Spotting for at least 48 hours after the procedure.  Follow these instructions at home:  Take over-the-counter and prescription medicines only as told by your doctor. Ask your doctor what medicines you can start taking again. This is very important if you take blood-thinning medicine.  Do not drive or use heavy machinery while taking prescription pain medicine.  For 3 days, or as long as your doctor tells you, avoid: ? Douching. ? Using tampons. ? Having sex.  If you use birth control (contraception), keep using it.  Limit activity for the first day after the procedure. Ask your doctor what activities are safe for you.  It is up to you to get the results of your procedure. Ask your doctor when your results will be ready.  Keep all follow-up visits as told by your doctor. This is important. Contact a doctor if:  You get a skin rash. Get help right away if:  You are bleeding a lot from your vagina. It is a lot of bleeding if you are using more than one pad an hour for 2 hours in a row.  You have clumps of blood (blood clots) coming from your vagina.  You have a fever.  You have chills  You have pain in your lower belly (pelvic area).  You have signs of infection, such as vaginal  discharge that is: ? Different than usual. ? Yellow. ? Bad-smelling.  You have very pain or cramps in your lower belly that do not get better with medicine.  You feel light-headed.  You feel dizzy.  You pass out (faint). Summary  If you did not have a tissue sample removed (did not have a biopsy), you may only have some spotting for a few days. You can go back to your normal activities.  If you had a tissue sample removed, it is common to have mild pain and spotting for 48 hours.  For 3 days, or as long as your doctor tells you, avoid douching, using tampons and having sex.  Get help right away if you have bleeding, very bad pain, or signs of infection. This information is not intended to replace advice given to you by your health care provider. Make sure you discuss any questions you have with your health care provider. Document Released: 02/04/2008 Document Revised: 05/07/2016 Document Reviewed: 05/07/2016 Elsevier Interactive Patient Education  2018 [employer]. Second Trimester of Pregnancy The second trimester is from week 14 through week 27 (months 4 through 6). The second trimester is often a time when you feel your best. Your body has adjusted to being pregnant, and you begin to feel better physically. Usually, morning sickness has lessened or quit completely, you may have more energy, and you may have an increase in  appetite. The second trimester is also a time when the fetus is growing rapidly. At the end of the sixth month, the fetus is about 9 inches long and weighs about 1 pounds. You will likely begin to feel the baby move (quickening) between 16 and 20 weeks of pregnancy. Body changes during your second trimester Your body continues to go through many changes during your second trimester. The changes vary from woman to woman.  Your weight will continue to increase. You will notice your lower abdomen bulging out.  You may begin to get stretch marks on your hips,  abdomen, and breasts.  You may develop headaches that can be relieved by medicines. The medicines should be approved by your health care provider.  You may urinate more often because the fetus is pressing on your bladder.  You may develop or continue to have heartburn as a result of your pregnancy.  You may develop constipation because certain hormones are causing the muscles that push waste through your intestines to slow down.  You may develop hemorrhoids or swollen, bulging veins (varicose veins).  You may have back pain. This is caused by: ? Weight gain. ? Pregnancy hormones that are relaxing the joints in your pelvis. ? A shift in weight and the muscles that support your balance.  Your breasts will continue to grow and they will continue to become tender.  Your gums may bleed and may be sensitive to brushing and flossing.  Dark spots or blotches (chloasma, mask of pregnancy) may develop on your face. This will likely fade after the baby is born.  A dark line from your belly button to the pubic area (linea nigra) may appear. This will likely fade after the baby is born.  You may have changes in your hair. These can include thickening of your hair, rapid growth, and changes in texture. Some women also have hair loss during or after pregnancy, or hair that feels dry or thin. Your hair will most likely return to normal after your baby is born.  What to expect at prenatal visits During a routine prenatal visit:  You will be weighed to make sure you and the fetus are growing normally.  Your blood pressure will be taken.  Your abdomen will be measured to track your baby's growth.  The fetal heartbeat will be listened to.  Any test results from the previous visit will be discussed.  Your health care provider may ask you:  How you are feeling.  If you are feeling the baby move.  If you have had any abnormal symptoms, such as leaking fluid, bleeding, severe headaches, or  abdominal cramping.  If you are using any tobacco products, including cigarettes, chewing tobacco, and electronic cigarettes.  If you have any questions.  Other tests that may be performed during your second trimester include:  Blood tests that check for: ? Low iron levels (anemia). ? High blood sugar that affects pregnant women (gestational diabetes) between 57 and 28 weeks. ? Rh antibodies. This is to check for a protein on red blood cells (Rh factor).  Urine tests to check for infections, diabetes, or protein in the urine.  An ultrasound to confirm the proper growth and development of the baby.  An amniocentesis to check for possible genetic problems.  Fetal screens for spina bifida and Down syndrome.  HIV (human immunodeficiency virus) testing. Routine prenatal testing includes screening for HIV, unless you choose not to have this test.  Follow these instructions at home: Medicines  Follow your health care provider's instructions regarding medicine use. Specific medicines may be either safe or unsafe to take during pregnancy.  Take a prenatal vitamin that contains at least 600 micrograms (mcg) of folic acid.  If you develop constipation, try taking a stool softener if your health care provider approves. Eating and drinking  Eat a balanced diet that includes fresh fruits and vegetables, whole grains, good sources of protein such as meat, eggs, or tofu, and low-fat dairy. Your health care provider will help you determine the amount of weight gain that is right for you.  Avoid raw meat and uncooked cheese. These carry germs that can cause birth defects in the baby.  If you have low calcium intake from food, talk to your health care provider about whether you should take a daily calcium supplement.  Limit foods that are high in fat and processed sugars, such as fried and sweet foods.  To prevent constipation: ? Drink enough fluid to keep your urine clear or pale  yellow. ? Eat foods that are high in fiber, such as fresh fruits and vegetables, whole grains, and beans. Activity  Exercise only as directed by your health care provider. Most women can continue their usual exercise routine during pregnancy. Try to exercise for 30 minutes at least 5 days a week. Stop exercising if you experience uterine contractions.  Avoid heavy lifting, wear low heel shoes, and practice good posture.  A sexual relationship may be continued unless your health care provider directs you otherwise. Relieving pain and discomfort  Wear a good support bra to prevent discomfort from breast tenderness.  Take warm sitz baths to soothe any pain or discomfort caused by hemorrhoids. Use hemorrhoid cream if your health care provider approves.  Rest with your legs elevated if you have leg cramps or low back pain.  If you develop varicose veins, wear support hose. Elevate your feet for 15 minutes, 3-4 times a day. Limit salt in your diet. Prenatal Care  Write down your questions. Take them to your prenatal visits.  Keep all your prenatal visits as told by your health care provider. This is important. Safety  Wear your seat belt at all times when driving.  Make a list of emergency phone numbers, including numbers for family, friends, the hospital, and police and fire departments. General instructions  Ask your health care provider for a referral to a local prenatal education class. Begin classes no later than the beginning of month 6 of your pregnancy.  Ask for help if you have counseling or nutritional needs during pregnancy. Your health care provider can offer advice or refer you to specialists for help with various needs.  Do not use hot tubs, steam rooms, or saunas.  Do not douche or use tampons or scented sanitary pads.  Do not cross your legs for long periods of time.  Avoid cat litter boxes and soil used by cats. These carry germs that can cause birth defects in the  baby and possibly loss of the fetus by miscarriage or stillbirth.  Avoid all smoking, herbs, alcohol, and unprescribed drugs. Chemicals in these products can affect the formation and growth of the baby.  Do not use any products that contain nicotine or tobacco, such as cigarettes and e-cigarettes. If you need help quitting, ask your health care provider.  Visit your dentist if you have not gone yet during your pregnancy. Use a soft toothbrush to brush your teeth and be gentle when you floss. Contact a health  care provider if:  You have dizziness.  You have mild pelvic cramps, pelvic pressure, or nagging pain in the abdominal area.  You have persistent nausea, vomiting, or diarrhea.  You have a bad smelling vaginal discharge.  You have pain when you urinate. Get help right away if:  You have a fever.  You are leaking fluid from your vagina.  You have spotting or bleeding from your vagina.  You have severe abdominal cramping or pain.  You have rapid weight gain or weight loss.  You have shortness of breath with chest pain.  You notice sudden or extreme swelling of your face, hands, ankles, feet, or legs.  You have not felt your baby move in over an hour.  You have severe headaches that do not go away when you take medicine.  You have vision changes. Summary  The second trimester is from week 14 through week 27 (months 4 through 6). It is also a time when the fetus is growing rapidly.  Your body goes through many changes during pregnancy. The changes vary from woman to woman.  Avoid all smoking, herbs, alcohol, and unprescribed drugs. These chemicals affect the formation and growth your baby.  Do not use any tobacco products, such as cigarettes, chewing tobacco, and e-cigarettes. If you need help quitting, ask your health care provider.  Contact your health care provider if you have any questions. Keep all prenatal visits as told by your health care provider. This is  important. This information is not intended to replace advice given to you by your health care provider. Make sure you discuss any questions you have with your health care provider. Document Released: 08/12/2001 Document Revised: 09/23/2016 Document Reviewed: 09/23/2016 Elsevier Interactive Patient Education  [employer].

## 2018-03-16 NOTE — Progress Notes (Signed)
Pt complaints of constipation-would like Rx and dry skin- esp on nipple area. Pt states that her skin will peel from her nipple.

## 2018-03-30 ENCOUNTER — Ambulatory Visit (HOSPITAL_COMMUNITY): Payer: Medicaid Other

## 2018-04-06 ENCOUNTER — Ambulatory Visit (HOSPITAL_COMMUNITY)
Admission: RE | Admit: 2018-04-06 | Discharge: 2018-04-06 | Disposition: A | Discharge status: Home / Self Care | Payer: Medicaid Other | Source: Ambulatory Visit | Attending: Obstetrics and Gynecology | Admitting: Obstetrics and Gynecology

## 2018-04-06 ENCOUNTER — Other Ambulatory Visit: Payer: Self-pay | Admitting: Obstetrics and Gynecology

## 2018-04-06 DIAGNOSIS — O34219 Maternal care for unspecified type scar from previous cesarean delivery: Secondary | ICD-10-CM

## 2018-04-06 DIAGNOSIS — O99212 Obesity complicating pregnancy, second trimester: Secondary | ICD-10-CM

## 2018-04-06 DIAGNOSIS — Z3A27 27 weeks gestation of pregnancy: Secondary | ICD-10-CM

## 2018-04-06 DIAGNOSIS — Z362 Encounter for other antenatal screening follow-up: Secondary | ICD-10-CM | POA: Diagnosis not present

## 2018-04-06 DIAGNOSIS — Z3480 Encounter for supervision of other normal pregnancy, unspecified trimester: Secondary | ICD-10-CM

## 2018-04-13 ENCOUNTER — Other Ambulatory Visit: Payer: Medicaid Other

## 2018-04-13 ENCOUNTER — Ambulatory Visit (INDEPENDENT_AMBULATORY_CARE_PROVIDER_SITE_OTHER): Payer: Medicaid Other | Admitting: Obstetrics and Gynecology

## 2018-04-13 VITALS — BP 118/80 | HR 95 | Wt 211.0 lb

## 2018-04-13 DIAGNOSIS — O24419 Gestational diabetes mellitus in pregnancy, unspecified control: Secondary | ICD-10-CM | POA: Insufficient documentation

## 2018-04-13 DIAGNOSIS — Z3483 Encounter for supervision of other normal pregnancy, third trimester: Secondary | ICD-10-CM

## 2018-04-13 DIAGNOSIS — Z98891 History of uterine scar from previous surgery: Secondary | ICD-10-CM

## 2018-04-13 DIAGNOSIS — R87613 High grade squamous intraepithelial lesion on cytologic smear of cervix (HGSIL): Secondary | ICD-10-CM

## 2018-04-13 DIAGNOSIS — Z3480 Encounter for supervision of other normal pregnancy, unspecified trimester: Secondary | ICD-10-CM

## 2018-04-13 HISTORY — DX: Gestational diabetes mellitus in pregnancy, unspecified control: O24.419

## 2018-04-13 LAB — GLUCOSE, POCT (MANUAL RESULT ENTRY): POC Glucose: 204 mg/dl — AB (ref 70–99)

## 2018-04-13 MED ORDER — ACCU-CHEK NANO SMARTVIEW W/DEVICE KIT: 1.0000 | PACK | 0 refills | Status: DC

## 2018-04-13 MED ORDER — ACCU-CHEK FASTCLIX LANCETS MISC: 1.0000 [IU] | Freq: Four times a day (QID) | 12 refills | Status: DC

## 2018-04-13 MED ORDER — GLUCOSE BLOOD VI STRP: ORAL_STRIP | 12 refills | Status: DC

## 2018-04-13 NOTE — Progress Notes (Addendum)
   PRENATAL VISIT NOTE  Subjective:  Jennifer Cohen is a 27 y.o. [phone] at 10792w2d being seen today for ongoing prenatal care.  She is currently monitored for the following issues for this high-risk pregnancy and has Supervision of other normal pregnancy, antepartum; History of cesarean section; History of VBAC; HGSIL (high grade squamous intraepithelial lesion) on Pap smear of cervix; and Constipation during pregnancy on their problem list.  Patient reports no complaints.  Contractions: Not present. Vag. Bleeding: None.  Movement: Present. Denies leaking of fluid.   The following portions of the patient's history were reviewed and updated as appropriate: allergies, current medications, past family history, past medical history, past social history, past surgical history and problem list. Problem list updated.  Objective:   Vitals:   04/13/18 0845  BP: 118/80  Pulse: 95  Weight: 211 lb (95.7 kg)    Fetal Status: Fetal Heart Rate (bpm): 150 Fundal Height: 28 cm Movement: Present     General:  Alert, oriented and cooperative. Patient is in no acute distress.  Skin: Skin is warm and dry. No rash noted.   Cardiovascular: Normal heart rate noted  Respiratory: Normal respiratory effort, no problems with respiration noted  Abdomen: Soft, gravid, appropriate for gestational age.  Pain/Pressure: Absent     Pelvic: Cervical exam deferred        Extremities: Normal range of motion.     Mental Status: Normal mood and affect. Normal behavior. Normal judgment and thought content.   Assessment and Plan:  Pregnancy: [id number] at 7192w2d  1. Supervision of other normal pregnancy, antepartum Patient is doing well without complaints Third trimester labs and glucola today Patient declined tdap - Glucose Tolerance, 2 Hours w/1 Hour - CBC - HIV antibody - RPR  Fasting blood draw collected and multiple attempts were made to collect 1 hour. POC CBG at 1 hour 204- Patient with diagnosis of GDM.  Testing supplies and referral to diabetic coordinator made  2. History of cesarean section Patient had VBAC x 2 and plans another TOLAC Patient plans to use birth control pills for contraception  3. HGSIL (high grade squamous intraepithelial lesion) on Pap smear of cervix S/p colpo Plan to repeat pap smear pp  Preterm labor symptoms and general obstetric precautions including but not limited to vaginal bleeding, contractions, leaking of fluid and fetal movement were reviewed in detail with the patient. Please refer to After Visit Summary for other counseling recommendations.  Return in about 2 weeks (around 04/27/2018) for ROB.  No future appointments.  Catalina AntiguaPeggy Martavious Hartel, MD

## 2018-04-13 NOTE — Addendum Note (Signed)
Addended by: Catalina AntiguaONSTANT, Jetty Berland on: 04/13/2018 09:49 AM   Modules accepted: Orders

## 2018-04-13 NOTE — Addendum Note (Signed)
Addended by: Marya LandryFOSTER, Amiyah Shryock D on: 04/13/2018 10:00 AM   Modules accepted: Orders

## 2018-04-21 LAB — CBC
HEMOGLOBIN: 11.1 g/dL (ref 11.1–15.9)
Hematocrit: 35.1 % (ref 34.0–46.6)
MCH: 25 pg — AB (ref 26.6–33.0)
MCHC: 31.6 g/dL (ref 31.5–35.7)
MCV: 79 fL (ref 79–97)
Platelets: 375 10*3/uL (ref 150–450)
RBC: 4.44 x10E6/uL (ref 3.77–5.28)
RDW: 14.3 % (ref 12.3–15.4)
WBC: 13.9 10*3/uL — ABNORMAL HIGH (ref 3.4–10.8)

## 2018-04-21 LAB — GLUCOSE TOLERANCE, 2 HOURS W/ 1HR
Glucose, 1 hour: 96 mg/dL (ref 65–179)
Glucose, 2 hour: 91 mg/dL (ref 65–152)
Glucose, Fasting: 93 mg/dL — ABNORMAL HIGH (ref 65–91)

## 2018-04-21 LAB — RPR: RPR Ser Ql: NONREACTIVE

## 2018-04-21 LAB — HIV ANTIBODY (ROUTINE TESTING W REFLEX): HIV Screen 4th Generation wRfx: NONREACTIVE

## 2018-04-27 ENCOUNTER — Encounter: Payer: Medicaid Other | Admitting: Obstetrics

## 2018-05-18 ENCOUNTER — Ambulatory Visit (INDEPENDENT_AMBULATORY_CARE_PROVIDER_SITE_OTHER): Payer: Medicaid Other | Admitting: Obstetrics and Gynecology

## 2018-05-18 VITALS — BP 120/80 | HR 109 | Wt 209.0 lb

## 2018-05-18 DIAGNOSIS — O24419 Gestational diabetes mellitus in pregnancy, unspecified control: Secondary | ICD-10-CM

## 2018-05-18 DIAGNOSIS — Z8759 Personal history of other complications of pregnancy, childbirth and the puerperium: Secondary | ICD-10-CM

## 2018-05-18 DIAGNOSIS — Z98891 History of uterine scar from previous surgery: Secondary | ICD-10-CM

## 2018-05-18 DIAGNOSIS — O0990 Supervision of high risk pregnancy, unspecified, unspecified trimester: Secondary | ICD-10-CM

## 2018-05-18 NOTE — Progress Notes (Signed)
Prenatal Visit Note Date: 05/18/2018 Clinic: [facility]  Subjective:  Jennifer ConnersKristy Dewey is a 27 y.o. [id number] at 8698w2d being seen today for ongoing prenatal care.  She is currently monitored for the following issues for this high-risk pregnancy and has Supervision of high risk pregnancy, antepartum; History of VBAC; HGSIL (high grade squamous intraepithelial lesion) on Pap smear of cervix; Constipation during pregnancy; Gestational diabetes mellitus (GDM) in third trimester; and History of shoulder dystocia in prior pregnancy on their problem list.  Patient reports no complaints.   Contractions: Irritability. Vag. Bleeding: None.  Movement: Present. Denies leaking of fluid.   The following portions of the patient's history were reviewed and updated as appropriate: allergies, current medications, past family history, past medical history, past social history, past surgical history and problem list. Problem list updated.  Objective:   Vitals:   05/18/18 0901  BP: 120/80  Pulse: (!) 109  Weight: 209 lb (94.8 kg)    Fetal Status: Fetal Heart Rate (bpm): 160   Movement: Present     General:  Alert, oriented and cooperative. Patient is in no acute distress.  Skin: Skin is warm and dry. No rash noted.   Cardiovascular: Normal heart rate noted  Respiratory: Normal respiratory effort, no problems with respiration noted  Abdomen: Soft, gravid, appropriate for gestational age. Pain/Pressure: Present     Pelvic:  Cervical exam deferred        Extremities: Normal range of motion.     Mental Status: Normal mood and affect. Normal behavior. Normal judgment and thought content.   Urinalysis:      Assessment and Plan:  Pregnancy: [id number] at 2998w2d  1. Gestational diabetes mellitus (GDM) in third trimester, gestational diabetes method of control unspecified Pt states she has everything but the meter. Has not had a DM education visit and I don't see one in the system.pt states she hasn't had anything to  eat or drink yet today. CBG is 88. Will get an a1c. D/w her importance of good glycemic control for pregnancy. Will have front desk set up for asap dm education visit. D/w her AM fasting and 2hr PP goals and how to check blood sugars. Pt doesn't have anyone in family who has dm and she didn't have it in other pregnancies. I told pt to start checking on her own since may not be able to see dm education before next week.  - Hemoglobin A1c  2. Supervision of high risk pregnancy, antepartum Unsure of BC. Wants something with least amount of systemic effect. D/w her re: IUD.  - Hemoglobin A1c  3. History of shoulder dystocia in prior pregnancy PL updated. Pt doesn't remember SD but does remember had to wait for attending to deliver placenta. Can d/w pt more nv  4. History of VBAC  Preterm labor symptoms and general obstetric precautions including but not limited to vaginal bleeding, contractions, leaking of fluid and fetal movement were reviewed in detail with the patient. Please refer to After Visit Summary for other counseling recommendations.  Return in about 1 week (around 05/25/2018) for 7-10d rob.   Niles BingPickens, Joni Colegrove, MD  Pt cancelled on 8/2 and no show'ed on 8/30

## 2018-05-19 LAB — HEMOGLOBIN A1C
Est. average glucose Bld gHb Est-mCnc: 111 mg/dL
Hgb A1c MFr Bld: 5.5 % (ref 4.8–5.6)

## 2018-05-25 ENCOUNTER — Ambulatory Visit (INDEPENDENT_AMBULATORY_CARE_PROVIDER_SITE_OTHER): Payer: Medicaid Other | Admitting: Obstetrics & Gynecology

## 2018-05-25 ENCOUNTER — Encounter: Payer: Self-pay | Admitting: Obstetrics & Gynecology

## 2018-05-25 VITALS — BP 121/84 | HR 108 | Wt 210.0 lb

## 2018-05-25 DIAGNOSIS — O24419 Gestational diabetes mellitus in pregnancy, unspecified control: Secondary | ICD-10-CM | POA: Diagnosis not present

## 2018-05-25 DIAGNOSIS — O0990 Supervision of high risk pregnancy, unspecified, unspecified trimester: Secondary | ICD-10-CM

## 2018-05-25 LAB — GLUCOSE, POCT (MANUAL RESULT ENTRY): POC GLUCOSE: 110 mg/dL — AB (ref 70–99)

## 2018-05-25 MED ORDER — ACCU-CHEK FASTCLIX LANCETS MISC: 1.0000 | Freq: Four times a day (QID) | 5 refills | Status: DC

## 2018-05-25 MED ORDER — GLUCOSE BLOOD VI STRP: ORAL_STRIP | 12 refills | Status: DC

## 2018-05-25 NOTE — Progress Notes (Signed)
   PRENATAL VISIT NOTE  Subjective:  Jennifer Cohen is a 27 y.o. [phone] at 5838w2d being seen today for ongoing prenatal care.  She is currently monitored for the following issues for this high-risk pregnancy and has Supervision of high risk pregnancy, antepartum; History of VBAC; HGSIL (high grade squamous intraepithelial lesion) on Pap smear of cervix; Constipation during pregnancy; Gestational diabetes mellitus (GDM) in third trimester; and History of shoulder dystocia in prior pregnancy on their problem list.  Patient reports no complaints.  Contractions: Not present. Vag. Bleeding: None.  Movement: Present. Denies leaking of fluid.   The following portions of the patient's history were reviewed and updated as appropriate: allergies, current medications, past family history, past medical history, past social history, past surgical history and problem list. Problem list updated.  Objective:   Vitals:   05/25/18 1548  BP: 121/84  Pulse: (!) 108  Weight: 210 lb (95.3 kg)    Fetal Status: Fetal Heart Rate (bpm): 150   Movement: Present     General:  Alert, oriented and cooperative. Patient is in no acute distress.  Skin: Skin is warm and dry. No rash noted.   Cardiovascular: Normal heart rate noted  Respiratory: Normal respiratory effort, no problems with respiration noted  Abdomen: Soft, gravid, appropriate for gestational age.  Pain/Pressure: Present     Pelvic: Cervical exam deferred        Extremities: Normal range of motion.     Mental Status: Normal mood and affect. Normal behavior. Normal judgment and thought content.   Assessment and Plan:  Pregnancy: [id number] at 738w2d  1. Supervision of high risk pregnancy, antepartum Has not been able to do her BG testing, needs proper supplies - glucose blood (ACCU-CHEK GUIDE) test strip; Use 1 test strip to check blood glucose 4 times daily  Dispense: 100 each; Refill: 12 - ACCU-CHEK FASTCLIX LANCETS MISC; 1 each by Percutaneous  route 4 (four) times daily.  Dispense: 100 each; Refill: 5 - POCT Glucose (CBG)  2. Gestational diabetes mellitus (GDM) in third trimester, gestational diabetes method of control unspecified Stressed dietary and testing compliance - glucose blood (ACCU-CHEK GUIDE) test strip; Use 1 test strip to check blood glucose 4 times daily  Dispense: 100 each; Refill: 12 - ACCU-CHEK FASTCLIX LANCETS MISC; 1 each by Percutaneous route 4 (four) times daily.  Dispense: 100 each; Refill: 5 - POCT Glucose (CBG) - US MFM OB FOLLOW UP; Future  Preterm labor symptoms and general obstetric precautions including but not limited to vaginal bleeding, contractions, leaking of fluid and fetal movement were reviewed in detail with the patient. Please refer to After Visit Summary for other counseling recommendations.  Return in about 1 week (around 06/01/2018).  Future Appointments  Date Time Provider Department Center  05/26/2018  8:45 AM [facility]    Scheryl DarterJames Jameika Kinn, MD

## 2018-05-26 ENCOUNTER — Ambulatory Visit: Payer: Medicaid Other | Admitting: Registered"

## 2018-05-26 ENCOUNTER — Other Ambulatory Visit: Payer: Self-pay

## 2018-05-26 DIAGNOSIS — O24419 Gestational diabetes mellitus in pregnancy, unspecified control: Secondary | ICD-10-CM

## 2018-05-26 DIAGNOSIS — O0990 Supervision of high risk pregnancy, unspecified, unspecified trimester: Secondary | ICD-10-CM

## 2018-05-26 MED ORDER — ACCU-CHEK FASTCLIX LANCETS MISC: 1.0000 | Freq: Four times a day (QID) | 5 refills | Status: DC

## 2018-06-01 ENCOUNTER — Encounter: Payer: Medicaid Other | Admitting: Obstetrics & Gynecology

## 2018-06-01 ENCOUNTER — Ambulatory Visit (INDEPENDENT_AMBULATORY_CARE_PROVIDER_SITE_OTHER): Payer: Medicaid Other | Admitting: Obstetrics

## 2018-06-01 ENCOUNTER — Encounter: Payer: Self-pay | Admitting: Obstetrics

## 2018-06-01 VITALS — BP 124/83 | HR 97 | Wt 215.0 lb

## 2018-06-01 DIAGNOSIS — R87613 High grade squamous intraepithelial lesion on cytologic smear of cervix (HGSIL): Secondary | ICD-10-CM

## 2018-06-01 DIAGNOSIS — O0993 Supervision of high risk pregnancy, unspecified, third trimester: Secondary | ICD-10-CM

## 2018-06-01 DIAGNOSIS — O24419 Gestational diabetes mellitus in pregnancy, unspecified control: Secondary | ICD-10-CM

## 2018-06-01 DIAGNOSIS — Z8759 Personal history of other complications of pregnancy, childbirth and the puerperium: Secondary | ICD-10-CM

## 2018-06-01 DIAGNOSIS — O0990 Supervision of high risk pregnancy, unspecified, unspecified trimester: Secondary | ICD-10-CM

## 2018-06-01 DIAGNOSIS — Z98891 History of uterine scar from previous surgery: Secondary | ICD-10-CM

## 2018-06-01 NOTE — Progress Notes (Signed)
Pt has no concerns today.  Pt states she is checking her sugars.

## 2018-06-01 NOTE — Progress Notes (Signed)
Subjective:  Jennifer Cohen is a 27 y.o. [phone] at [redacted]w[redacted]d being seen today for ongoing prenatal care.  She is currently monitored for the following issues for this high-risk pregnancy and has Supervision of high risk pregnancy, antepartum; History of VBAC; HGSIL (high grade squamous intraepithelial lesion) on Pap smear of cervix; Constipation during pregnancy; Gestational diabetes mellitus (GDM) in third trimester; and History of shoulder dystocia in prior pregnancy on their problem list.  Patient reports no complaints.  Contractions: Not present. Vag. Bleeding: None.  Movement: Present. Denies leaking of fluid.   The following portions of the patient's history were reviewed and updated as appropriate: allergies, current medications, past family history, past medical history, past social history, past surgical history and problem list. Problem list updated.  Objective:   Vitals:   06/01/18 0923  BP: 124/83  Pulse: 97  Weight: 215 lb (97.5 kg)    Fetal Status: Fetal Heart Rate (bpm): 150   Movement: Present     General:  Alert, oriented and cooperative. Patient is in no acute distress.  Skin: Skin is warm and dry. No rash noted.   Cardiovascular: Normal heart rate noted  Respiratory: Normal respiratory effort, no problems with respiration noted  Abdomen: Soft, gravid, appropriate for gestational age. Pain/Pressure: Present     Pelvic:  Cervical exam deferred        Extremities: Normal range of motion.  Edema: None  Mental Status: Normal mood and affect. Normal behavior. Normal judgment and thought content.   Urinalysis:      Assessment and Plan:  Pregnancy: [id number] at [redacted]w[redacted]d  1. Supervision of high risk pregnancy, antepartum  2. Gestational diabetes mellitus (GDM) in third trimester, gestational diabetes method of control unspecified - stable blood sugars  3. History of shoulder dystocia in prior pregnancy  4. History of cesarean section  5. History of VBAC x 2  6. HGSIL  (high grade squamous intraepithelial lesion) on Pap smear of cervix - repeat pap 3-4 months postpartum  Preterm labor symptoms and general obstetric precautions including but not limited to vaginal bleeding, contractions, leaking of fluid and fetal movement were reviewed in detail with the patient. Please refer to After Visit Summary for other counseling recommendations.  Return in about 1 week (around 06/08/2018) for ROB.   Brock Bad, MD

## 2018-06-08 ENCOUNTER — Ambulatory Visit (INDEPENDENT_AMBULATORY_CARE_PROVIDER_SITE_OTHER): Payer: Medicaid Other | Admitting: Obstetrics & Gynecology

## 2018-06-08 ENCOUNTER — Encounter: Payer: Self-pay | Admitting: Obstetrics & Gynecology

## 2018-06-08 ENCOUNTER — Other Ambulatory Visit (HOSPITAL_COMMUNITY)
Admission: RE | Admit: 2018-06-08 | Discharge: 2018-06-08 | Disposition: A | Discharge status: Home / Self Care | Payer: Medicaid Other | Source: Ambulatory Visit | Attending: Obstetrics & Gynecology | Admitting: Obstetrics & Gynecology

## 2018-06-08 VITALS — BP 120/84 | HR 97 | Wt 216.0 lb

## 2018-06-08 DIAGNOSIS — Z3A36 36 weeks gestation of pregnancy: Secondary | ICD-10-CM | POA: Diagnosis not present

## 2018-06-08 DIAGNOSIS — O0993 Supervision of high risk pregnancy, unspecified, third trimester: Secondary | ICD-10-CM | POA: Insufficient documentation

## 2018-06-08 DIAGNOSIS — Z98891 History of uterine scar from previous surgery: Secondary | ICD-10-CM

## 2018-06-08 DIAGNOSIS — O0990 Supervision of high risk pregnancy, unspecified, unspecified trimester: Secondary | ICD-10-CM

## 2018-06-08 DIAGNOSIS — O24410 Gestational diabetes mellitus in pregnancy, diet controlled: Secondary | ICD-10-CM

## 2018-06-08 NOTE — Patient Instructions (Signed)

## 2018-06-08 NOTE — Progress Notes (Signed)
Pt request cervix check today.  

## 2018-06-08 NOTE — Progress Notes (Signed)
   PRENATAL VISIT NOTE  Subjective:  Jennifer Cohen is a 27 y.o. [phone] at [redacted]w[redacted]d being seen today for ongoing prenatal care.  She is currently monitored for the following issues for this high-risk pregnancy and has Supervision of high risk pregnancy, antepartum; History of VBAC; HGSIL (high grade squamous intraepithelial lesion) on Pap smear of cervix; Constipation during pregnancy; Gestational diabetes mellitus (GDM) in third trimester; and History of shoulder dystocia in prior pregnancy on their problem list.  Patient reports no complaints.  Contractions: Irritability. Vag. Bleeding: None.  Movement: Present. Denies leaking of fluid.   The following portions of the patient's history were reviewed and updated as appropriate: allergies, current medications, past family history, past medical history, past social history, past surgical history and problem list. Problem list updated.  Objective:   Vitals:   06/08/18 0917  BP: 120/84  Pulse: 97  Weight: 216 lb (98 kg)    Fetal Status: Fetal Heart Rate (bpm): 140   Movement: Present     General:  Alert, oriented and cooperative. Patient is in no acute distress.  Skin: Skin is warm and dry. No rash noted.   Cardiovascular: Normal heart rate noted  Respiratory: Normal respiratory effort, no problems with respiration noted  Abdomen: Soft, gravid, appropriate for gestational age.  Pain/Pressure: Present     Pelvic: Cervical exam performed        Extremities: Normal range of motion.  Edema: None  Mental Status: Normal mood and affect. Normal behavior. Normal judgment and thought content.   Assessment and Plan:  Pregnancy: [id number] at [redacted]w[redacted]d  1. Supervision of high risk pregnancy, antepartum Routine 36 weeks - Strep Gp B NAA - GC/Chlamydia probe amp (Ali Molina)not at [facility]  2. Diet controlled gestational diabetes mellitus (GDM) in third trimester States FBS and PP within range  3. History of VBAC F/u US in 2 weeks  Preterm labor  symptoms and general obstetric precautions including but not limited to vaginal bleeding, contractions, leaking of fluid and fetal movement were reviewed in detail with the patient. Please refer to After Visit Summary for other counseling recommendations.  Return in about 1 week (around 06/15/2018).  Future Appointments  Date Time Provider Department Center  06/22/2018 10:15 AM [facility] [place] 4 [facility]-US    Scheryl Darter, MD

## 2018-06-09 LAB — GC/CHLAMYDIA PROBE AMP (~~LOC~~) NOT AT ARMC
Chlamydia: NEGATIVE
Neisseria Gonorrhea: NEGATIVE

## 2018-06-10 ENCOUNTER — Encounter (HOSPITAL_COMMUNITY): Payer: Self-pay | Admitting: *Deleted

## 2018-06-10 ENCOUNTER — Inpatient Hospital Stay (HOSPITAL_COMMUNITY)
Admission: AD | Admit: 2018-06-10 | Discharge: 2018-06-12 | DRG: 807 | Disposition: A | Discharge status: Home / Self Care | Payer: Medicaid Other | Attending: Obstetrics and Gynecology | Admitting: Obstetrics and Gynecology

## 2018-06-10 ENCOUNTER — Other Ambulatory Visit: Payer: Self-pay

## 2018-06-10 DIAGNOSIS — Z3A36 36 weeks gestation of pregnancy: Secondary | ICD-10-CM | POA: Diagnosis not present

## 2018-06-10 DIAGNOSIS — O99824 Streptococcus B carrier state complicating childbirth: Secondary | ICD-10-CM | POA: Diagnosis present

## 2018-06-10 DIAGNOSIS — O42013 Preterm premature rupture of membranes, onset of labor within 24 hours of rupture, third trimester: Secondary | ICD-10-CM | POA: Diagnosis not present

## 2018-06-10 DIAGNOSIS — O479 False labor, unspecified: Secondary | ICD-10-CM | POA: Diagnosis present

## 2018-06-10 DIAGNOSIS — O34219 Maternal care for unspecified type scar from previous cesarean delivery: Secondary | ICD-10-CM | POA: Diagnosis present

## 2018-06-10 DIAGNOSIS — F17210 Nicotine dependence, cigarettes, uncomplicated: Secondary | ICD-10-CM | POA: Diagnosis present

## 2018-06-10 DIAGNOSIS — O42913 Preterm premature rupture of membranes, unspecified as to length of time between rupture and onset of labor, third trimester: Secondary | ICD-10-CM | POA: Diagnosis present

## 2018-06-10 DIAGNOSIS — O24420 Gestational diabetes mellitus in childbirth, diet controlled: Secondary | ICD-10-CM | POA: Diagnosis present

## 2018-06-10 DIAGNOSIS — O99334 Smoking (tobacco) complicating childbirth: Secondary | ICD-10-CM | POA: Diagnosis present

## 2018-06-10 DIAGNOSIS — O4290 Premature rupture of membranes, unspecified as to length of time between rupture and onset of labor, unspecified weeks of gestation: Secondary | ICD-10-CM | POA: Diagnosis present

## 2018-06-10 LAB — HIV ANTIBODY (ROUTINE TESTING W REFLEX): HIV Screen 4th Generation wRfx: NONREACTIVE

## 2018-06-10 LAB — GLUCOSE, CAPILLARY
GLUCOSE-CAPILLARY: 80 mg/dL (ref 70–99)
GLUCOSE-CAPILLARY: 88 mg/dL (ref 70–99)

## 2018-06-10 LAB — CBC
HEMATOCRIT: 37 % (ref 36.0–46.0)
Hemoglobin: 12.7 g/dL (ref 12.0–15.0)
MCH: 26.2 pg (ref 26.0–34.0)
MCHC: 34.3 g/dL (ref 30.0–36.0)
MCV: 76.4 fL — AB (ref 80.0–100.0)
NRBC: 0 % (ref 0.0–0.2)
PLATELETS: 365 10*3/uL (ref 150–400)
RBC: 4.84 MIL/uL (ref 3.87–5.11)
RDW: 14.7 % (ref 11.5–15.5)
WBC: 12.5 10*3/uL — ABNORMAL HIGH (ref 4.0–10.5)

## 2018-06-10 LAB — RPR: RPR Ser Ql: NONREACTIVE

## 2018-06-10 LAB — TYPE AND SCREEN
ABO/RH(D): A POS
Antibody Screen: NEGATIVE

## 2018-06-10 LAB — STREP GP B NAA: STREP GROUP B AG: POSITIVE — AB

## 2018-06-10 MED ORDER — BETAMETHASONE SOD PHOS & ACET 6 (3-3) MG/ML IJ SUSP
12.0000 mg | INTRAMUSCULAR | Status: DC
  Administered 2018-06-10: 12 mg via INTRAMUSCULAR
  Filled 2018-06-10 (×2): qty 2

## 2018-06-10 MED ORDER — LACTATED RINGERS IV SOLN
INTRAVENOUS | Status: DC
  Administered 2018-06-10 (×2): via INTRAVENOUS

## 2018-06-10 MED ORDER — WITCH HAZEL-GLYCERIN EX PADS: 1.0000 "application " | MEDICATED_PAD | CUTANEOUS | Status: DC | PRN

## 2018-06-10 MED ORDER — TERBUTALINE SULFATE 1 MG/ML IJ SOLN
0.2500 mg | Freq: Once | INTRAMUSCULAR | Status: DC | PRN
  Filled 2018-06-10: qty 1

## 2018-06-10 MED ORDER — LACTATED RINGERS IV SOLN: 500.0000 mL | INTRAVENOUS | Status: DC | PRN

## 2018-06-10 MED ORDER — COCONUT OIL OIL: 1.0000 "application " | TOPICAL_OIL | Status: DC | PRN

## 2018-06-10 MED ORDER — OXYCODONE-ACETAMINOPHEN 5-325 MG PO TABS
2.0000 | ORAL_TABLET | ORAL | Status: DC | PRN
  Administered 2018-06-10: 2 via ORAL
  Filled 2018-06-10: qty 2

## 2018-06-10 MED ORDER — SODIUM CHLORIDE 0.9 % IV SOLN
5.0000 10*6.[IU] | Freq: Once | INTRAVENOUS | Status: AC
  Administered 2018-06-10: 5 10*6.[IU] via INTRAVENOUS
  Filled 2018-06-10: qty 5

## 2018-06-10 MED ORDER — SOD CITRATE-CITRIC ACID 500-334 MG/5ML PO SOLN: 30.0000 mL | ORAL | Status: DC | PRN

## 2018-06-10 MED ORDER — ZOLPIDEM TARTRATE 5 MG PO TABS: 5.0000 mg | ORAL_TABLET | Freq: Every evening | ORAL | Status: DC | PRN

## 2018-06-10 MED ORDER — ACETAMINOPHEN 325 MG PO TABS
650.0000 mg | ORAL_TABLET | ORAL | Status: DC | PRN
  Administered 2018-06-11 – 2018-06-12 (×4): 650 mg via ORAL
  Filled 2018-06-10 (×4): qty 2

## 2018-06-10 MED ORDER — IBUPROFEN 600 MG PO TABS
600.0000 mg | ORAL_TABLET | Freq: Four times a day (QID) | ORAL | Status: DC
  Administered 2018-06-10 – 2018-06-12 (×7): 600 mg via ORAL
  Filled 2018-06-10 (×7): qty 1

## 2018-06-10 MED ORDER — SENNOSIDES-DOCUSATE SODIUM 8.6-50 MG PO TABS
2.0000 | ORAL_TABLET | ORAL | Status: DC
  Administered 2018-06-10 – 2018-06-12 (×2): 2 via ORAL
  Filled 2018-06-10 (×2): qty 2

## 2018-06-10 MED ORDER — DIBUCAINE 1 % RE OINT: 1.0000 "application " | TOPICAL_OINTMENT | RECTAL | Status: DC | PRN

## 2018-06-10 MED ORDER — SODIUM CHLORIDE 0.9 % IV SOLN
2.0000 g | Freq: Once | INTRAVENOUS | Status: AC
  Administered 2018-06-10: 2 g via INTRAVENOUS
  Filled 2018-06-10: qty 2

## 2018-06-10 MED ORDER — OXYTOCIN 40 UNITS IN LACTATED RINGERS INFUSION - SIMPLE MED
2.5000 [IU]/h | INTRAVENOUS | Status: DC
  Administered 2018-06-10: 2.5 [IU]/h via INTRAVENOUS
  Filled 2018-06-10: qty 1000

## 2018-06-10 MED ORDER — OXYTOCIN 40 UNITS IN LACTATED RINGERS INFUSION - SIMPLE MED
1.0000 m[IU]/min | INTRAVENOUS | Status: DC
  Administered 2018-06-10: 2 m[IU]/min via INTRAVENOUS

## 2018-06-10 MED ORDER — LIDOCAINE HCL (PF) 1 % IJ SOLN
30.0000 mL | INTRAMUSCULAR | Status: DC | PRN
  Filled 2018-06-10: qty 30

## 2018-06-10 MED ORDER — ONDANSETRON HCL 4 MG/2ML IJ SOLN: 4.0000 mg | Freq: Four times a day (QID) | INTRAMUSCULAR | Status: DC | PRN

## 2018-06-10 MED ORDER — TETANUS-DIPHTH-ACELL PERTUSSIS 5-2.5-18.5 LF-MCG/0.5 IM SUSP: 0.5000 mL | Freq: Once | INTRAMUSCULAR | Status: DC

## 2018-06-10 MED ORDER — ACETAMINOPHEN 325 MG PO TABS: 650.0000 mg | ORAL_TABLET | ORAL | Status: DC | PRN

## 2018-06-10 MED ORDER — ONDANSETRON HCL 4 MG PO TABS: 4.0000 mg | ORAL_TABLET | ORAL | Status: DC | PRN

## 2018-06-10 MED ORDER — DIPHENHYDRAMINE HCL 25 MG PO CAPS: 25.0000 mg | ORAL_CAPSULE | Freq: Four times a day (QID) | ORAL | Status: DC | PRN

## 2018-06-10 MED ORDER — MEASLES, MUMPS & RUBELLA VAC ~~LOC~~ INJ: 0.5000 mL | INJECTION | Freq: Once | SUBCUTANEOUS | Status: DC

## 2018-06-10 MED ORDER — SIMETHICONE 80 MG PO CHEW: 80.0000 mg | CHEWABLE_TABLET | ORAL | Status: DC | PRN

## 2018-06-10 MED ORDER — LACTATED RINGERS AMNIOINFUSION
INTRAVENOUS | Status: DC
  Administered 2018-06-10: 17:00:00 via INTRAUTERINE
  Filled 2018-06-10: qty 1000

## 2018-06-10 MED ORDER — PENICILLIN G 3 MILLION UNITS IVPB - SIMPLE MED
3.0000 10*6.[IU] | INTRAVENOUS | Status: DC
  Administered 2018-06-10 (×2): 3 10*6.[IU] via INTRAVENOUS
  Filled 2018-06-10 (×2): qty 100

## 2018-06-10 MED ORDER — PRENATAL MULTIVITAMIN CH
1.0000 | ORAL_TABLET | Freq: Every day | ORAL | Status: DC
  Administered 2018-06-11 – 2018-06-12 (×2): 1 via ORAL
  Filled 2018-06-10 (×2): qty 1

## 2018-06-10 MED ORDER — ONDANSETRON HCL 4 MG/2ML IJ SOLN: 4.0000 mg | INTRAMUSCULAR | Status: DC | PRN

## 2018-06-10 MED ORDER — OXYTOCIN BOLUS FROM INFUSION: 500.0000 mL | Freq: Once | INTRAVENOUS | Status: DC

## 2018-06-10 MED ORDER — BENZOCAINE-MENTHOL 20-0.5 % EX AERO
1.0000 "application " | INHALATION_SPRAY | CUTANEOUS | Status: DC | PRN
  Filled 2018-06-10: qty 56

## 2018-06-10 MED ORDER — OXYCODONE-ACETAMINOPHEN 5-325 MG PO TABS: 1.0000 | ORAL_TABLET | ORAL | Status: DC | PRN

## 2018-06-10 MED ORDER — FENTANYL CITRATE (PF) 100 MCG/2ML IJ SOLN
50.0000 ug | INTRAMUSCULAR | Status: DC | PRN
  Administered 2018-06-10 (×5): 100 ug via INTRAVENOUS
  Filled 2018-06-10 (×5): qty 2

## 2018-06-10 NOTE — MAU Note (Signed)
Pt states that she thinks her water broke at 0500-unsure of color because "it was dark and she wasn't paying attention." Reports occasional contractions after the gush. Reports good fetal movement

## 2018-06-10 NOTE — Progress Notes (Addendum)
LABOR PROGRESS NOTE  Jennifer Cohen is a 27 y.o. [id number] at [redacted]w[redacted]d  admitted for PROM.   Subjective: Patient feeling better now that FB out. Reports occasional ctx.   Objective: BP (!) 114/55   Pulse 77   Temp 98.1 F (36.7 C) (Oral)   Resp 18   Ht 5' (1.524 m)   Wt 98 kg   LMP 09/27/2017   BMI 42.18 kg/m  or  Vitals:   06/10/18 0907 06/10/18 0956 06/10/18 1057 06/10/18 1159  BP: 126/71 (!) 112/59 119/69 (!) 114/55  Pulse: 89 91 90 77  Resp:  18 20 18   Temp:  98.7 F (37.1 C)  98.1 F (36.7 C)  TempSrc:  Oral  Oral  Weight:      Height:        Dilation: 4 Effacement (%): 60 Cervical Position: Middle Station: -2 Presentation: Vertex Exam by:: Dr Aggie Hacker: baseline rate 130, moderate varibility, + acel, no decel Toco: Irregular   Labs: Lab Results  Component Value Date   WBC 12.5 (H) 06/10/2018   HGB 12.7 06/10/2018   HCT 37.0 06/10/2018   MCV 76.4 (L) 06/10/2018   PLT 365 06/10/2018    Patient Active Problem List   Diagnosis Date Noted  . PROM (premature rupture of membranes) 06/10/2018  . History of shoulder dystocia in prior pregnancy 05/18/2018  . Gestational diabetes mellitus (GDM) in third trimester 04/13/2018  . HGSIL (high grade squamous intraepithelial lesion) on Pap smear of cervix 03/16/2018  . Constipation during pregnancy 03/16/2018  . History of VBAC 01/19/2018  . Supervision of high risk pregnancy, antepartum 12/15/2017  . Uterine contractions during pregnancy 04/07/2014    Assessment / Plan: 27 y.o. [id number] at [redacted]w[redacted]d here for PROM at 0400 on 10/10. Has received BMZ x1. GBS+ status, on PCN. History notable for 2 prior VBACs.   Labor: TOLAC. S/p FB. Start Pitocin 2x2 and titrate as needed.  Fetal Wellbeing:  Cat I  Pain Control:  Patient declines epidural. IV Fentanyl prn.  Anticipated MOD:  NSVD   Marcy Siren, D.O. OB Fellow  06/10/2018, 1:19 PM

## 2018-06-10 NOTE — Progress Notes (Signed)
LABOR PROGRESS NOTE  Jennifer Cohen is a Jennifer y.o. [id number] at [redacted]w[redacted]d  admitted for PROM.   Subjective: Doing well, feeling more intense contractions.   Objective: BP 140/76   Pulse 84   Temp 98.2 F (36.8 C) (Oral)   Resp 18   Ht 5' (1.524 m)   Wt 98 kg   LMP 09/27/2017   BMI 42.18 kg/m  or  Vitals:   06/10/18 1457 06/10/18 1550 06/10/18 1634 06/10/18 1716  BP: 123/64 137/67 131/68 140/76  Pulse: 76 79 81 84  Resp:  20 18 18   Temp:   98.2 F (36.8 C)   TempSrc:   Oral   Weight:      Height:        Dilation: 6 Effacement (%): 70 Cervical Position: Middle Station: -2 Presentation: Vertex Exam by:: Cgoodnight,RNC  FHT: baseline rate 125, moderate varibility, +acel, recurrent variables decel every few min  Toco: Irregular  Labs: Lab Results  Component Value Date   WBC 12.5 (H) 06/10/2018   HGB 12.7 06/10/2018   HCT 37.0 06/10/2018   MCV 76.4 (L) 06/10/2018   PLT 365 06/10/2018    Patient Active Problem List   Diagnosis Date Noted  . PROM (premature rupture of membranes) 06/10/2018  . History of shoulder dystocia in prior pregnancy 05/18/2018  . Gestational diabetes mellitus (GDM) in third trimester 04/13/2018  . HGSIL (high grade squamous intraepithelial lesion) on Pap smear of cervix 03/16/2018  . Constipation during pregnancy 03/16/2018  . History of VBAC 01/19/2018  . Supervision of high risk pregnancy, antepartum 12/15/2017  . Uterine contractions during pregnancy 04/07/2014    Assessment / Plan: Jennifer y.o. Jennifer Cohen at [redacted]w[redacted]d here for PROM. GBS positive, PCN.   Labor: TOLAC, progressing. Cont pit. Placed IUPC with amnioinfusion given recurrent variables. Will monitor closely.  Fetal Wellbeing:  Cat 2 strip with variables  Pain Control:  IV fentanyl PRN, no epidural  Anticipated MOD:  NSVD   Leticia Penna, D.O. Family Medicine PGY-1  06/10/2018, 5:19 PM

## 2018-06-10 NOTE — Progress Notes (Signed)
LABOR PROGRESS NOTE  Dan Dissinger is a 27 y.o. [id number] at [redacted]w[redacted]d  admitted for PROM.   Subjective: Patient feeling ctx occasionally. Not uncomfortable.   Objective: BP 126/71   Pulse 89   Temp 98.6 F (37 C) (Oral)   Resp 18   Ht 5' (1.524 m)   Wt 98 kg   LMP 09/27/2017   BMI 42.18 kg/m  or  Vitals:   06/10/18 0522 06/10/18 0619 06/10/18 0800 06/10/18 0907  BP: 117/75  (!) 98/31 126/71  Pulse: 100  92 89  Resp: 20  18   Temp: 98.3 F (36.8 C)  98.6 F (37 C)   TempSrc: Oral  Oral   Weight:  98 kg    Height:  5' (1.524 m)      Dilation: 1.5 Effacement (%): 40, 50 Cervical Position: Middle Station: -2 Presentation: Vertex Exam by:: Dr Earlene Plater  FHT: baseline rate 130, moderate varibility, 10x10 acel, no decel Toco: Irregular   Labs: Lab Results  Component Value Date   WBC 12.5 (H) 06/10/2018   HGB 12.7 06/10/2018   HCT 37.0 06/10/2018   MCV 76.4 (L) 06/10/2018   PLT 365 06/10/2018    Patient Active Problem List   Diagnosis Date Noted  . PROM (premature rupture of membranes) 06/10/2018  . History of shoulder dystocia in prior pregnancy 05/18/2018  . Gestational diabetes mellitus (GDM) in third trimester 04/13/2018  . HGSIL (high grade squamous intraepithelial lesion) on Pap smear of cervix 03/16/2018  . Constipation during pregnancy 03/16/2018  . History of VBAC 01/19/2018  . Supervision of high risk pregnancy, antepartum 12/15/2017  . Uterine contractions during pregnancy 04/07/2014    Assessment / Plan: 27 y.o. [id number] at [redacted]w[redacted]d here for PROM at 0400 on 10/10. Has received BMZ x1. GBS+ status, received Amp x1. Will now start PCN. History notable for 2 prior VBACs.   Labor: TOLAC. FB placed at 0915 without complications.  Fetal Wellbeing:  Cat I  Pain Control:  Patient declines epidural. IV Fentanyl prn.  Anticipated MOD:  NSVD   Marcy Siren, D.O. OB Fellow  06/10/2018, 9:28 AM
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
## 2018-06-10 NOTE — Progress Notes (Signed)
Talked with nursery about patient feeding wishes. Pt wishes to breast pump express and bottle feed.  Ok to start with formula.

## 2018-06-10 NOTE — Progress Notes (Signed)
CBG done : result 85  Did not populate from the glucose monitor

## 2018-06-10 NOTE — Anesthesia Pain Management Evaluation Note (Signed)
  CRNA Pain Management Visit Note  Patient: Jennifer Cohen, 27 y.o., female  "Hello I am a member of the anesthesia team at [facility]. We have an anesthesia team available at all times to provide care throughout the hospital, including epidural management and anesthesia for C-section. I don't know your plan for the delivery whether it a natural birth, water birth, IV sedation, nitrous supplementation, doula or epidural, but we want to meet your pain goals."   1.Was your pain managed to your expectations on prior hospitalizations?   Yes   2.What is your expectation for pain management during this hospitalization?     Labor support without medications  3.How can we help you reach that goal? Pt desires natural child birth  Record the patient's initial score and the patient's pain goal.   Pain: 0  Pain Goal: 10 [facility] wants you to be able to say your pain was always managed very well.  Jennifer Cohen 06/10/2018

## 2018-06-10 NOTE — MAU Note (Signed)
PT SAYS  THINKS SROM AT 0445.   VE IN OFFICE- OPEN. PNC  WITH FAMINA.    DENIES HSV AND MRSA.  GBS- COLLECTED  THIS WEEK

## 2018-06-11 LAB — GLUCOSE, CAPILLARY: Glucose-Capillary: 85 mg/dL (ref 70–99)
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
## 2018-06-11 NOTE — Lactation Note (Signed)
This note was copied from a baby's chart. Lactation Consultation Note  Patient Name: Jennifer Cohen [id number] Date: 06/11/2018  Mom states she has decided to pump and bottle feed. Obtaining small amounts of colostrum.  Baby is receiving formula supplementation.  Instructed to pump every 3 hours x 15 minutes to induce lactation.  Mom denies questions or concerns.   Maternal Data    Feeding Feeding Type: Bottle Fed - Formula Nipple Type: Slow - flow  LATCH Score                   Interventions    Lactation Tools Discussed/Used     Consult Status      Huston Foley 06/11/2018, 1:42 PM

## 2018-06-11 NOTE — Progress Notes (Signed)
MOB informed CSW she does not yet have a safe sleeping space for baby at this time- information for baby box provided and needing to be completed before receiving baby box. MOB will need to complete information/ receive baby box before discharge.   Stacy Gardner, LCSW Clinical Social Worker  [facility] [employer]  [phone]

## 2018-06-11 NOTE — Progress Notes (Addendum)
POSTPARTUM PROGRESS NOTE  Post Partum Day 1 Subjective:  Jennifer Cohen is a 27 y.o. [id number] [redacted]w[redacted]d s/p VBAC. Had A1GDM in pregnancy.  No acute events overnight.  Pt denies problems with ambulating, voiding or po intake.  She denies nausea or vomiting.  Pain is poorly controlled, reports 7/10 cramping pain that has not been controlled with ibuprofen.  She has had flatus. She has not had bowel movement.  Lochia Small.   Denies HA, chills, visual changes, RUQ pain, SOB, dizziness or lightheadedness.   Objective: Blood pressure 135/89, pulse 90, temperature 98.2 F (36.8 C), temperature source Oral, resp. rate 18, height 5' (1.524 m), weight 98 kg, last menstrual period 09/27/2017, SpO2 100 %, unknown if currently breastfeeding.  Physical Exam:  General: alert, cooperative and no distress Lochia:normal flow Chest: CTAB Heart: RRR no m/r/g Abdomen: distant BS, soft, mildly ttp. Uterine Fundus: firm, 2 in above umbilicus DVT Evaluation: No calf swelling or tenderness Extremities: trace edema  Recent Labs    06/10/18 0554  HGB 12.7  HCT 37.0    Assessment/Plan:  ASSESSMENT: Jennifer Cohen is a 27 y.o. [name] [redacted]w[redacted]d s/p VBAC , hx of G1GDM. BS have been wnl since admission.  Boy, outpatient circ Both breast + bottle Plans POP #Pain: one-time dose of oxycodone for pain not responding to ibuprofen #D/C: plan to discharge PPD 2    LOS: 1 day   Myrtie Hawk, Medical Student 06/11/2018, 10:27 AM    I confirm that I have verified the information documented in the student's note and that I have also personally reperformed the physical exam and all medical decision making activities.   Luna Kitchens CNM

## 2018-06-11 NOTE — Lactation Note (Signed)
This note was copied from a baby's chart. Lactation Consultation Note  Patient Name: Jennifer Cohen [id number] Date: 06/11/2018 Reason for consult: Initial assessment;Late-preterm 34-36.6wks P4, 8 hour , LPTI Mom feeding plan is breast and bottle feeding. Mom stated she would try latch him again maybe tomorrow she is willing try this although she said originally she would only pump. He was opening his mouth but not suckling at breast. Mom initial plan only to formula feed and give infant pumped breast milk. Per mom, non of her other children latched to the breast. LC notice mom nipples are soft and everted when she demonstrated hand expression. Infant was given formula 1 hour prior LC entering room not interested in latching at this time. LC discussed I&O. Reviewed Baby & Me book's Breastfeeding Basics.  Mom knows to pump q3h for 15-20 min. Breast milk induction and stimulation.  Mom made aware of O/P services, breastfeeding support groups, community resources, and our phone # for post-discharge questions.   Mom's plan: 1. Willing try latch him to breast again. 2. Pump every 3 hours and give infant any EBM then give formula. 3. Will give formula after giving any EBM or after latching him to breast. Maternal Data Formula Feeding for Exclusion: No Has patient been taught Hand Expression?: Yes(Mom hand expressed colostrum given infant on finger by cousin.)  Feeding    LATCH Score Latch: Repeated attempts needed to sustain latch, nipple held in mouth throughout feeding, stimulation needed to elicit sucking reflex.  Audible Swallowing: None  Type of Nipple: Everted at rest and after stimulation  Comfort (Breast/Nipple): Soft / non-tender  Hold (Positioning): Assistance needed to correctly position infant at breast and maintain latch.  LATCH Score: 6  Interventions Interventions: Breast feeding basics reviewed;Support pillows;Assisted with latch;Position options;Skin to  skin;Breast massage;Hand express;Hand pump;DEBP  Lactation Tools Discussed/Used Pump Review: Setup, frequency, and cleaning Initiated by:: by Nurse Date initiated:: 06/11/18   Consult Status Consult Status: Follow-up Date: 06/11/18    Danelle Earthly 06/11/2018, 3:03 AM

## 2018-06-12 ENCOUNTER — Encounter (HOSPITAL_COMMUNITY): Payer: Self-pay

## 2018-06-12 MED ORDER — IBUPROFEN 600 MG PO TABS: 600.0000 mg | ORAL_TABLET | Freq: Four times a day (QID) | ORAL | 0 refills | Status: DC | PRN

## 2018-06-12 MED ORDER — SENNOSIDES-DOCUSATE SODIUM 8.6-50 MG PO TABS: 2.0000 | ORAL_TABLET | Freq: Every evening | ORAL | 0 refills | Status: DC | PRN

## 2018-06-12 NOTE — Discharge Summary (Addendum)
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      Discharge diagnosis: Preterm Pregnancy Delivered, VBAC and GDM A1                                                                                                Post partum procedures:None   Augmentation: Pitocin  Complications: None  Hospital course:  Induction of Labor With Vaginal Delivery   27y.o. yo [phone] admitted to the hospital 06/10/2018 for induction of labor.  Indication for induction: PROM.  Patient had an uncomplicated labor course as follows: Membrane Rupture Time/Date: 4:45 AM ,06/10/2018   Intrapartum Procedures: Episiotomy: None [1]                                         Lacerations:  None [1]  Patient had delivery of a Viable infant.  Information for the patient's newborn:  PhKyung, Muto0[[id number]]Delivery Method: Vaginal, Spontaneous(Filed from Delivery Summary)   06/10/2018  Details of delivery can be found in separate delivery note.  Patient had a routine postpartum course. Patient is discharged home 06/12/18.  Physical exam  Vitals:   06/11/18 1512 06/11/18 1917 06/11/18 2133 06/12/18 0545  BP: (!) 141/74 125/86 132/78 119/76  Pulse: 81 93 80 61  Resp: '18 18 18 18  ' Temp: 97.9 F (36.6 C) 97.8 F (36.6 C) 98 F (36.7 C) 97.6 F (36.4 C)  TempSrc: Oral Oral Oral Oral  SpO2: 100% 99%    Weight:      Height:       General: alert, cooperative and no distress Lochia: appropriate Uterine Fundus: firm Incision: N/A DVT  Evaluation: No evidence of DVT seen on physical exam. No significant calf/ankle edema. Labs: Lab Results  Component Value Date   WBC 12.5 (H) 06/10/2018   HGB 12.7 06/10/2018   HCT 37.0 06/10/2018   MCV 76.4 (L) 06/10/2018   PLT 365 06/10/2018   No flowsheet data found.  Discharge instruction: per After Visit Summary and "Baby and Me Booklet".  After visit meds:  Allergies as of 06/12/2018   No Known Allergies     Medication List    STOP taking these medications   ACCU-CHEK FASTCLIX LANCETS Misc   ACCU-CHEK NANO SMARTVIEW w/Device Kit   glucose blood test strip     TAKE these medications   ibuprofen 600 MG tablet Commonly known as:  ADVIL,MOTRIN Take 1 tablet (600 mg total) by mouth every 6 (six) hours as needed for mild pain or moderate pain.   senna-docusate 8.6-50  MG tablet Commonly known as:  Senokot-S Take 2 tablets by mouth at bedtime as needed for mild constipation or moderate constipation. What changed:    when to take this  reasons to take this       Diet: routine diet  Activity: Advance as tolerated. Pelvic rest for 6 weeks.   Outpatient follow up:4 weeks Follow up Appt: Future Appointments  Date Time Provider [place]  07/09/2018  8:45 AM Chancy Milroy, MD [facility] None   Postpartum contraception: Combination OCPs - counseled on starting at 6 weeks postpartum, counseled on potential impact on milk supply   Newborn Data: Live born female  Birth Weight: 5 lb 13.8 oz (2659 g) APGAR: 8, 9  Newborn Delivery   Birth date/time:  06/10/2018 18:51:00 Delivery type:  Vaginal, Spontaneous     Baby Feeding: Bottle and Breast Disposition:home with mother   06/12/2018 Patriciaann Clan, DO   Attestation: I have seen this patient and agree with the resident's documentation. I have examined them separately, and we have discussed the plan of care.  Lambert Mody. Juleen China, DO OB/GYN Fellow

## 2018-06-12 NOTE — Lactation Note (Signed)
This note was copied from a baby's chart. Lactation Consultation Note;  Family member bottle feeding formula as I went into room. Reports she pumped twice yesterday and obtained only drops- encouragement given. Does not want to put baby to the breast. Reviewed importance of frequent pumping and hand expression to promote a good milk supply. No questions at present.   Patient Name: Jennifer Cohen Date: 06/12/2018 Reason for consult: Follow-up assessment;Late-preterm 34-36.6wks   Maternal Data Formula Feeding for Exclusion: Yes Reason for exclusion: Mother's choice to formula and breast feed on admission Has patient been taught Hand Expression?: Yes Does the patient have breastfeeding experience prior to this delivery?: No  Feeding Feeding Type: Bottle Fed - Formula Nipple Type: Slow - flow  LATCH Score                   Interventions    Lactation Tools Discussed/Used     Consult Status Consult Status: Follow-up Date: 06/13/18 Follow-up type: In-patient    [name] 06/12/2018, 10:54 AM

## 2018-06-15 ENCOUNTER — Encounter: Payer: Medicaid Other | Admitting: Obstetrics and Gynecology

## 2018-06-22 ENCOUNTER — Ambulatory Visit (HOSPITAL_COMMUNITY): Payer: Medicaid Other

## 2018-07-05 NOTE — Progress Notes (Deleted)
Post Partum Exam  Jennifer Cohen is a 27 y.o. [phone] female who presents for a postpartum visit. She is {1-10:13787} {time; units:18646} postpartum following a {delivery:12449}. I have fully reviewed the prenatal and intrapartum course. The delivery was at *** gestational weeks.  Anesthesia: {anesthesia types:812}. Postpartum course has been ***. Baby's course has been ***. Baby is feeding by {breast/bottle:69}. Bleeding {vag bleed:12292}. Bowel function is {normal:32111}. Bladder function is {normal:32111}. Patient {is/is not:9024} sexually active. Contraception method is {contraceptive method:5051}. Postpartum depression screening:neg  {Common ambulatory SmartLinks:19316} Last pap smear done *** and was {Desc; normal/abnormal:11317::"Normal"}  Review of Systems {ros; complete:30496}    Objective:  unknown if currently breastfeeding.  General:  {gen appearance:16600}   Breasts:  {breast exam:1202::"inspection negative, no nipple discharge or bleeding, no masses or nodularity palpable"}  Lungs: {lung exam:16931}  Heart:  {heart exam:5510}  Abdomen: {abdomen exam:16834}   Vulva:  {labia exam:12198}  Vagina: {vagina exam:12200}  Cervix:  {cervix exam:14595}  Corpus: {uterus exam:12215}  Adnexa:  {adnexa exam:12223}  Rectal Exam: {rectal/vaginal exam:12274}        Assessment:    *** postpartum exam. Pap smear {done:10129} at today's visit.   Plan:   1. Contraception: {method:5051} 2. *** 3. Follow up in: {1-10:13787} {time; units:19136} or as needed.

## 2018-07-09 ENCOUNTER — Ambulatory Visit: Payer: Medicaid Other | Admitting: Obstetrics and Gynecology

## 2018-07-19 ENCOUNTER — Encounter: Payer: Self-pay | Admitting: Obstetrics and Gynecology

## 2018-08-27 IMAGING — US US MFM OB DETAIL+14 WK
1 series · 14 of 28 positions shown · non-contrast
Comparison: none

[Series 1: us mfm ob detail+14 wk · 94 acquisitions, 14 frames shown]
[im 4/94]
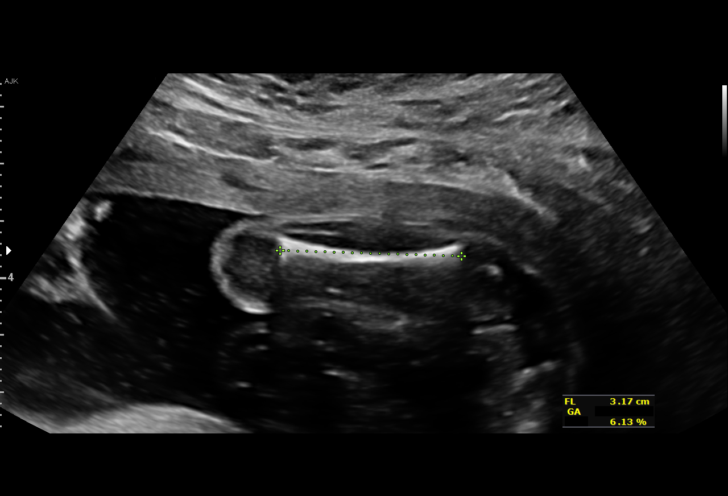
[im 11/94]
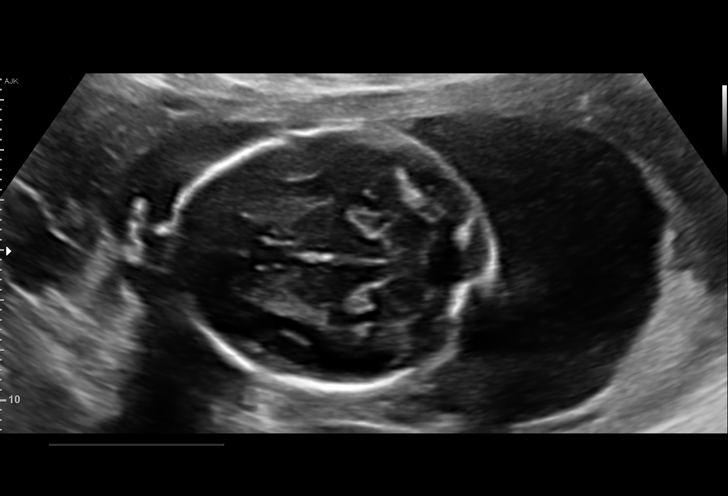
[im 18/94]
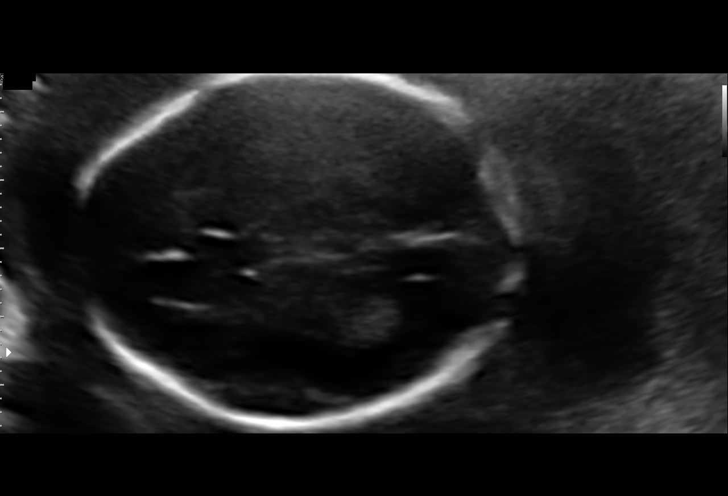
[im 25/94]
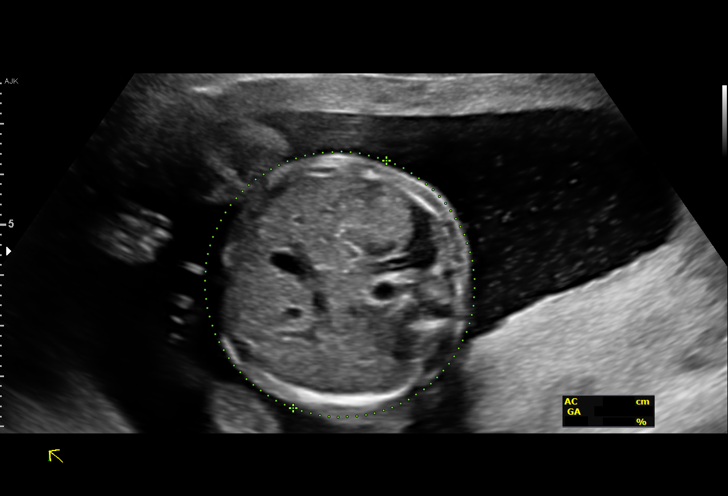
[im 32/94]
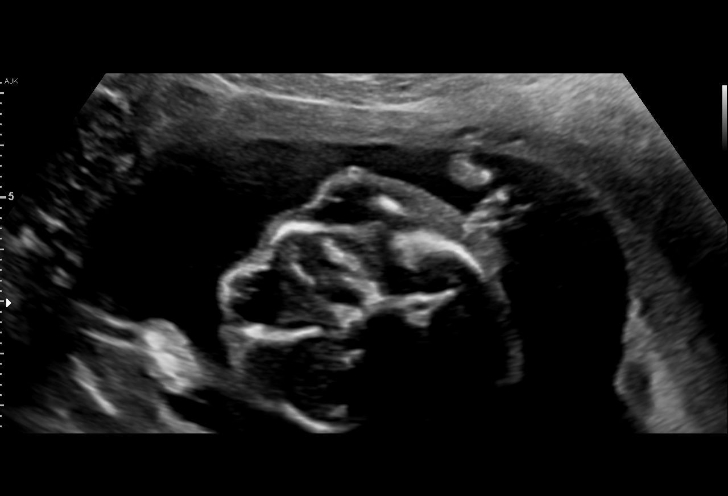
[im 38/94]
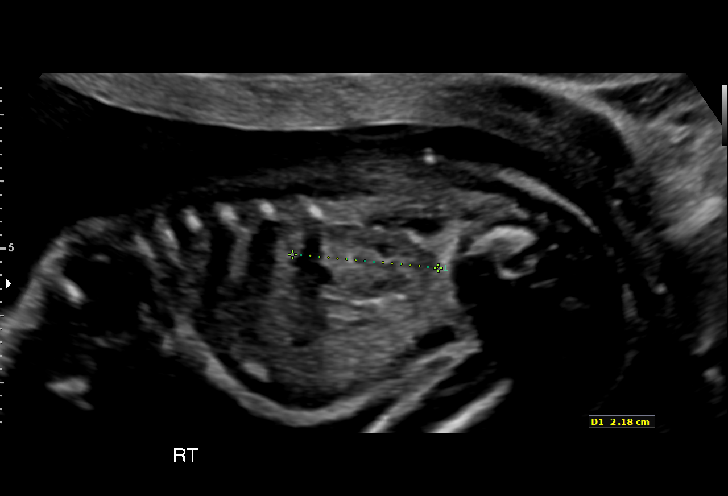
[im 45/94]
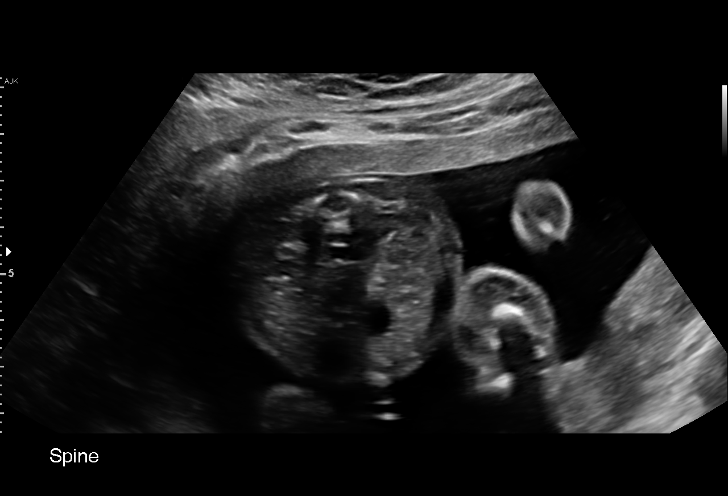
[im 52/94]
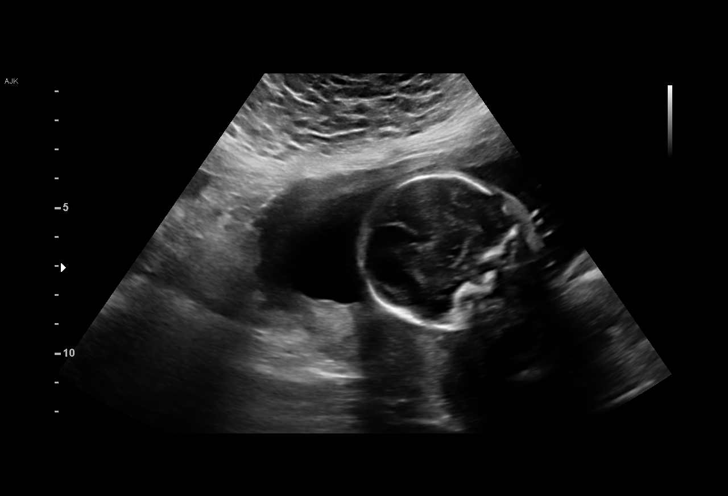
[im 59/94]
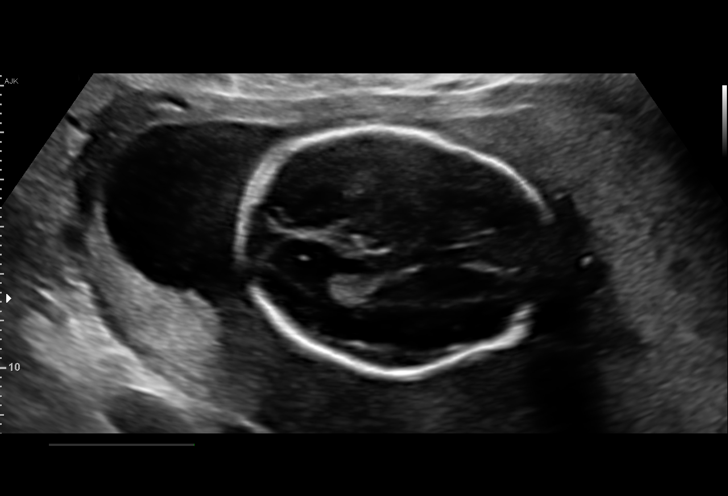
[im 66/94]
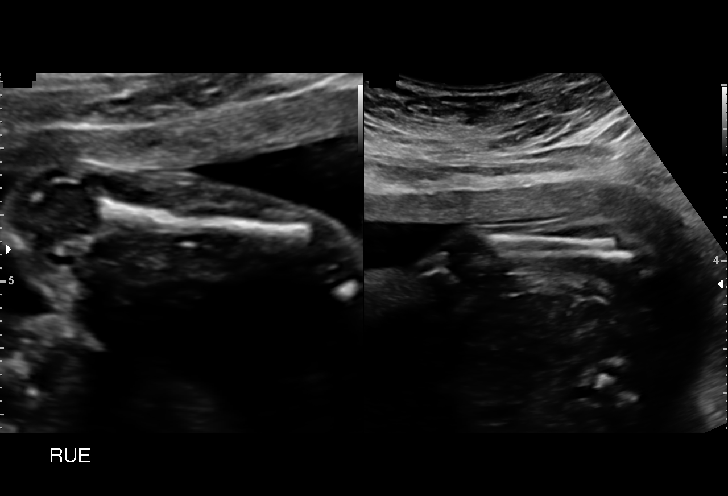
[im 73/94]
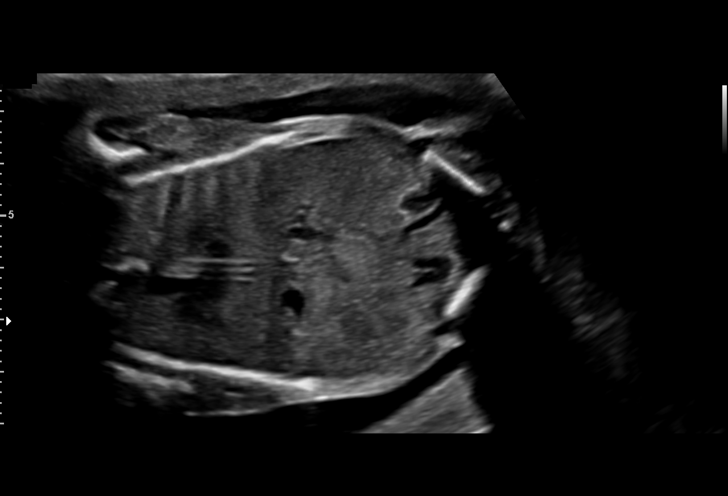
[im 80/94]
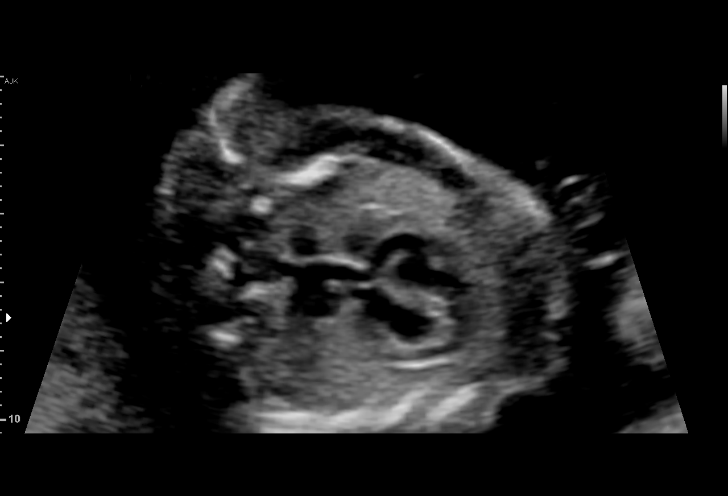
[im 87/94]
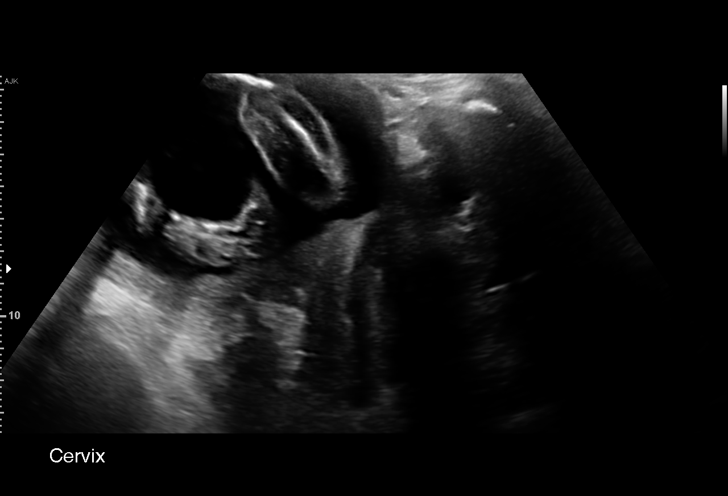
[im 94/94]
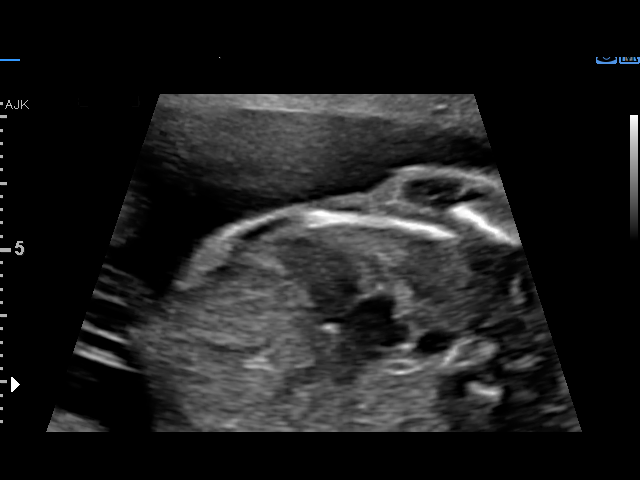

[14 of 28 positions shown; findings below may reference images not displayed]

Parenthood

1  DAYARET SABOR            [id number]      [phone]     [phone]
Indications

21 weeks gestation of pregnancy
Encounter for antenatal screening for
malformations
Obesity complicating pregnancy, second
trimester
Previous cesarean delivery, antepartum
OB History

Blood Type:            Height:  5'0"   Weight (lb):  198       BMI:
Gravidity:    7         Term:   3        Prem:   0        SAB:   2
TOP:          1       Ectopic:  0        Living: 3
Fetal Evaluation

Num Of Fetuses:     1
Fetal Heart         148
Rate(bpm):
Cardiac Activity:   Observed
Presentation:       Breech
Placenta:           Fundal, above cervical os
P. Cord Insertion:  Visualized, central

Amniotic Fluid
AFI FV:      Subjectively within normal limits

Largest Pocket(cm)
5.48
Biometry

BPD:      51.3  mm     G. Age:  21w 4d         59  %    CI:        72.46   %    70 - 86
FL/HC:      16.6   %    15.9 -
HC:      191.7  mm     G. Age:  21w 3d         46  %    HC/AC:      1.13        1.06 -
AC:      169.8  mm     G. Age:  22w 0d         65  %    FL/BPD:     62.2   %
FL:       31.9  mm     G. Age:  20w 0d          8  %    FL/AC:      18.8   %    20 - 24
HUM:      31.7  mm     G. Age:  20w 4d         29  %
CER:      22.7  mm     G. Age:  21w 2d         52  %
NFT:       5.2  mm

CM:        8.1  mm

Est. FW:     399  gm    0 lb 14 oz      40  %
Gestational Age

LMP:           21w 2d        Date:  09/27/17                 EDD:   07/04/18
U/S Today:     21w 2d                                        EDD:   07/04/18
Best:          21w 2d     Det. By:  LMP  (09/27/17)          EDD:   07/04/18
Anatomy

Cranium:               Appears normal         Aortic Arch:            Not well visualized
Cavum:                 Appears normal         Ductal Arch:            Appears normal
Ventricles:            Appears normal         Diaphragm:              Appears normal
Choroid Plexus:        Appears normal         Stomach:                Appears normal, left
sided
Cerebellum:            Appears normal         Abdomen:                Appears normal
Posterior Fossa:       Appears normal         Abdominal Wall:         Appears nml (cord
insert, abd wall)
Nuchal Fold:           Appears normal         Cord Vessels:           Appears normal (3
vessel cord)
Face:                  Appears normal         Kidneys:                Appear normal
(orbits and profile)
Lips:                  Appears normal         Bladder:                Appears normal
Thoracic:              Appears normal         Spine:                  Appears normal
Heart:                 Appears normal         Upper Extremities:      Appears normal
(4CH, axis, and
situs)
RVOT:                  Not well visualized    Lower Extremities:      Appears normal
LVOT:                  Appears normal

Other:  Fetus appears to be a male.LT Heel visualized. Nasal bone
visualized. Technically difficult due to maternal habitus and fetal
position.
Cervix Uterus Adnexa

Cervix
Length:           5.17  cm.
Normal appearance by transabdominal scan.

Uterus
No abnormality visualized.

Left Ovary
Not visualized.

Right Ovary
Not visualized.
Impression

Singleton intrauterine pregnancy at 21 +2 weeks here for
anatomic survey
Review of the anatomy shows no sonographic markers for
aneuploidy or structural anomalies
However, views of the fetal heart should be considered
suboptimal secondary to maternal body habitus and fetal
position
Amniotic fluid volume is normal
Estimated fetal weight shows growth in the 40th percentile
Recommendations

Recommend follow-up ultrasound examination in 4 weeks for
completion of the anatomic survey

## 2018-09-28 ENCOUNTER — Encounter: Payer: Self-pay | Admitting: Obstetrics and Gynecology

## 2018-09-28 ENCOUNTER — Ambulatory Visit (INDEPENDENT_AMBULATORY_CARE_PROVIDER_SITE_OTHER): Payer: Medicaid Other | Admitting: Obstetrics and Gynecology

## 2018-09-28 VITALS — BP 129/80 | HR 79 | Wt 199.1 lb

## 2018-09-28 DIAGNOSIS — Z3202 Encounter for pregnancy test, result negative: Secondary | ICD-10-CM | POA: Diagnosis not present

## 2018-09-28 DIAGNOSIS — Z3009 Encounter for other general counseling and advice on contraception: Secondary | ICD-10-CM

## 2018-09-28 LAB — POCT URINE PREGNANCY: Preg Test, Ur: NEGATIVE

## 2018-09-28 MED ORDER — MEDROXYPROGESTERONE ACETATE 150 MG/ML IM SUSP: 150.0000 mg | INTRAMUSCULAR | 4 refills | Status: DC

## 2018-09-28 NOTE — Progress Notes (Signed)
Patient ID: Jennifer ConnersKristy Cohen, female   DOB: 08/04/1991, 28 y.o.   MRN: [id number] Pt's appt was at 10:45. She presented to the office at @ 8:15 She had another appt at 10 I tried to see her before but was unable to get to her d/t to other scheduled pts. Apologized to pt. Pt was instructed to reschedule

## 2018-09-28 NOTE — Progress Notes (Signed)
Pt requests BC. She is leaning towards Depo. Last unprotected IC, months ago per pt. BC option list given.  RX sent to Pharmacy with refills.

## 2018-10-05 ENCOUNTER — Ambulatory Visit: Payer: Medicaid Other

## 2018-10-08 IMAGING — US US MFM OB FOLLOW-UP
1 series · 14 of 28 positions shown · non-contrast
Comparison: none

[Series 1: us mfm ob follow-up · 14 of 51 slices shown]
[im 2/51]
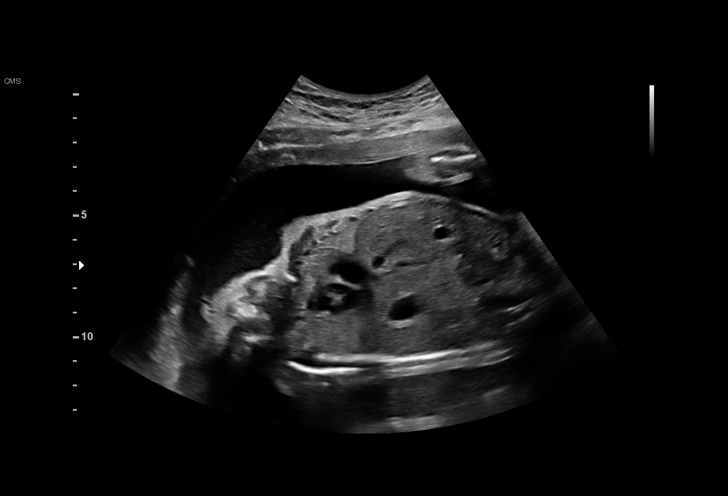
[im 6/51]
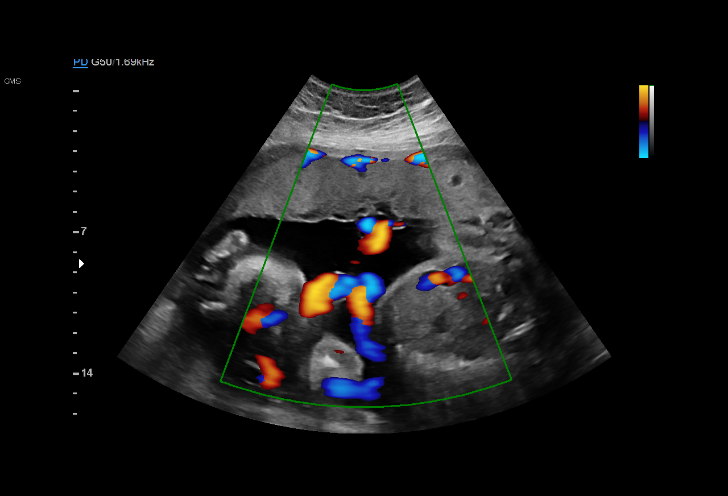
[im 10/51]
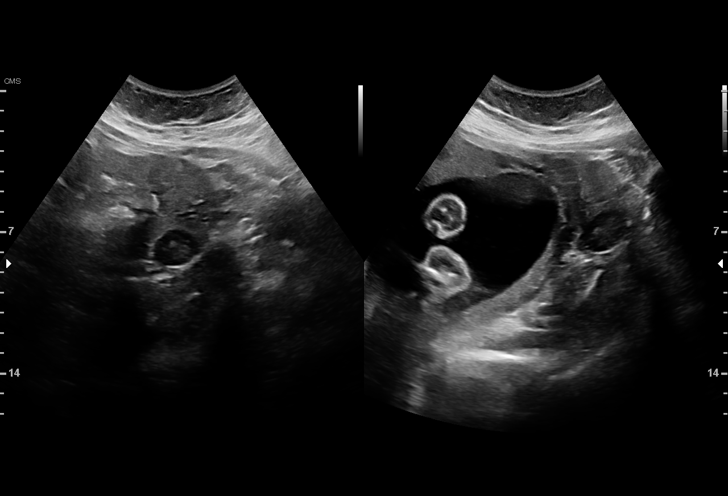
[im 13/51]
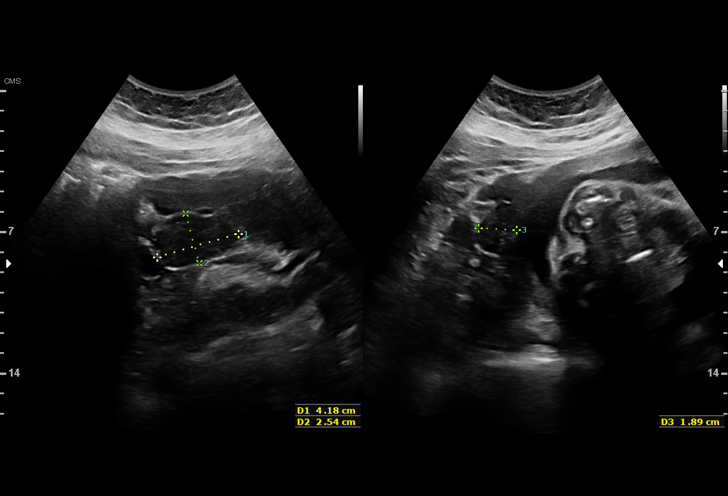
[im 17/51]
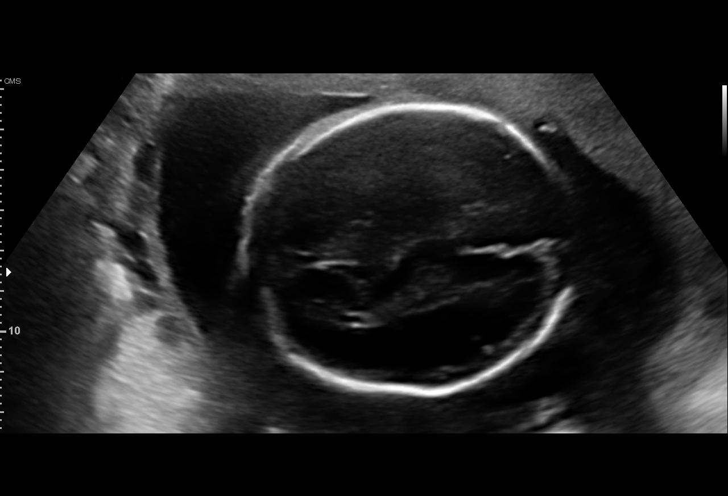
[im 21/51]
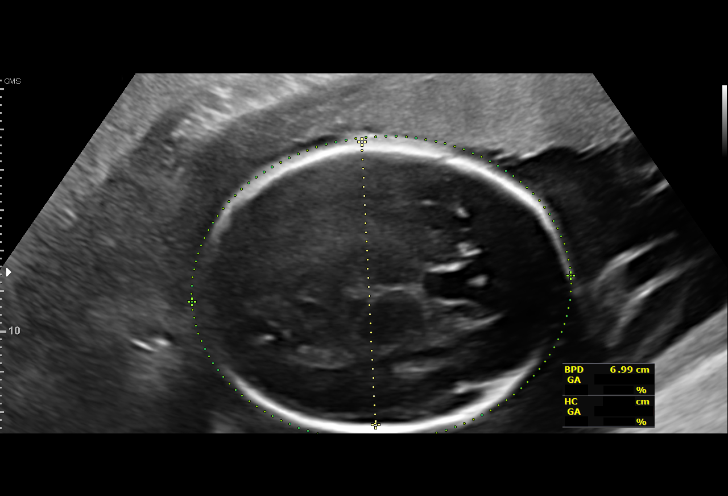
[im 25/51]
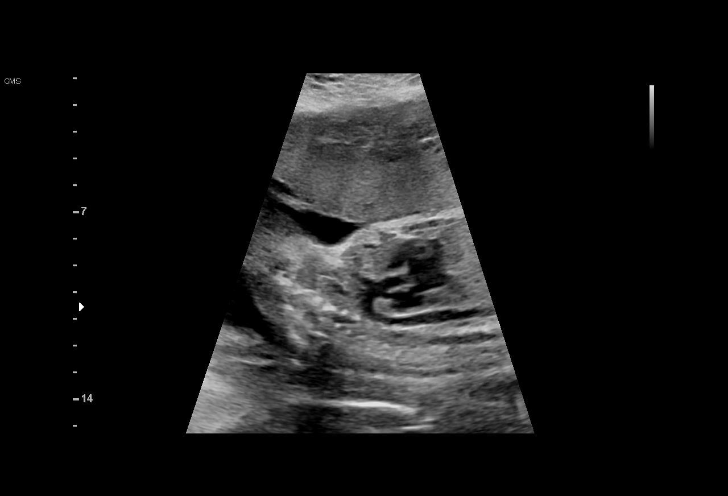
[im 28/51]
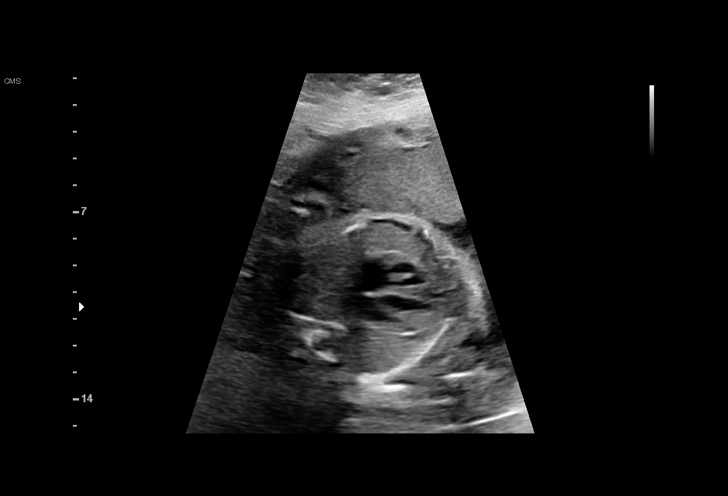
[im 32/51]
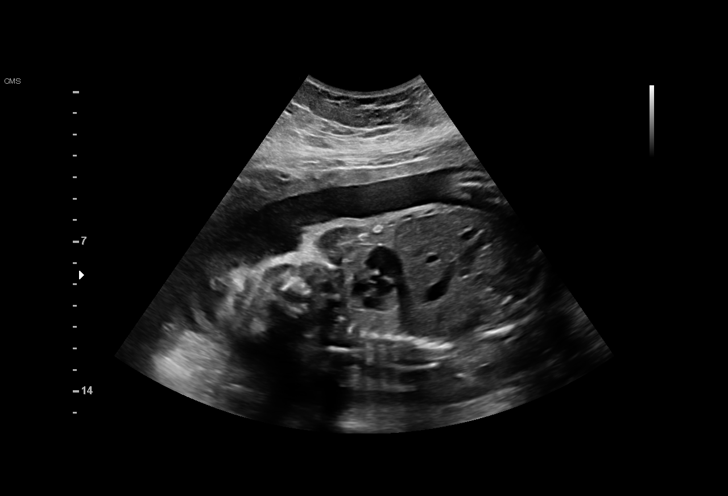
[im 36/51]
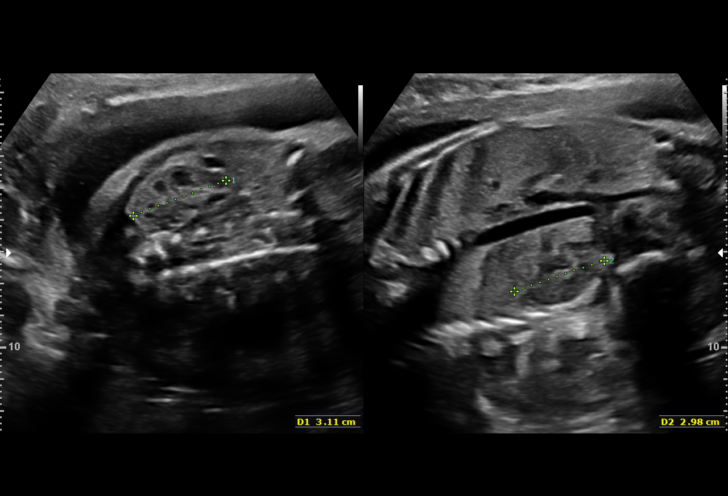
[im 39/51]
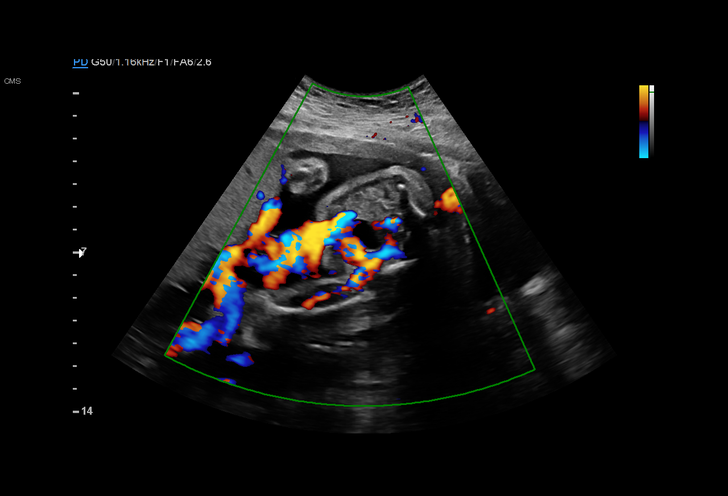
[im 43/51]
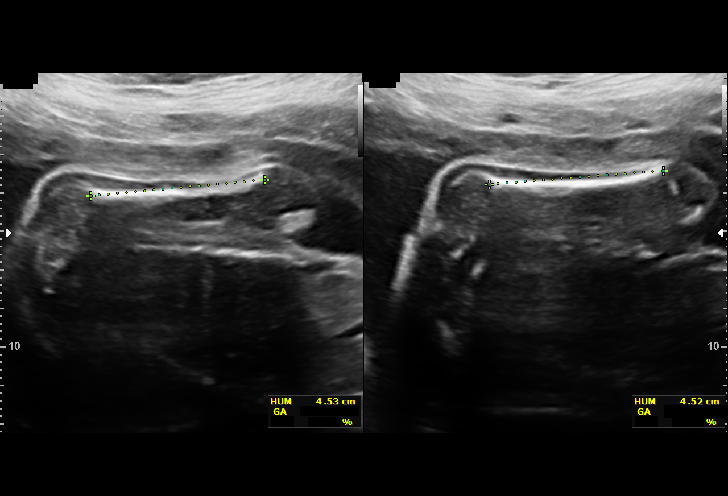
[im 47/51]
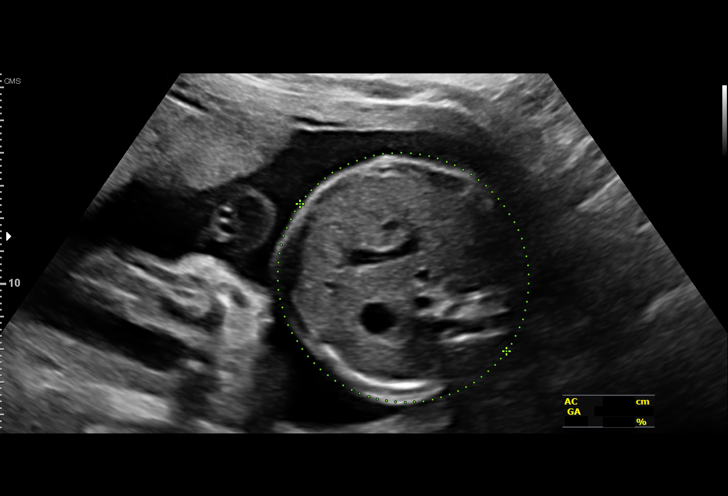
[im 51/51]
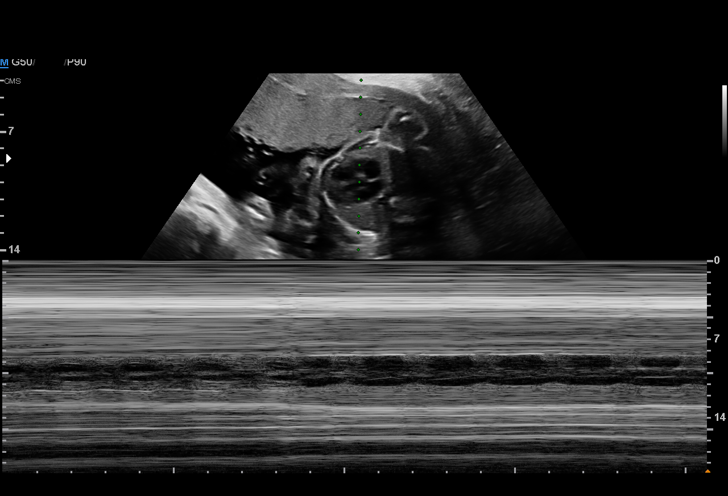

[14 of 28 positions shown; findings below may reference images not displayed]

Parenthood

1  KAUMI JUSTINA            [id number]      [phone]     [phone]
Indications

27 weeks gestation of pregnancy
Obesity complicating pregnancy, second
trimester
Previous cesarean delivery, antepartum
Antenatal follow-up for nonvisualized fetal
anatomy
OB History

Blood Type:            Height:  5'0"   Weight (lb):  198       BMI:
Gravidity:    7         Term:   3        Prem:   0        SAB:   2
TOP:          1       Ectopic:  0        Living: 3
Fetal Evaluation

Num Of Fetuses:     1
Fetal Heart         127
Rate(bpm):
Cardiac Activity:   Observed
Presentation:       Transverse, head to maternal right
Placenta:           Anterior
P. Cord Insertion:  Visualized

Amniotic Fluid
AFI FV:      Subjectively within normal limits

Largest Pocket(cm)
4.42
Biometry

BPD:      70.7  mm     G. Age:  28w 3d         75  %    CI:        72.83   %    70 - 86
FL/HC:      18.0   %    18.6 -
HC:      263.4  mm     G. Age:  28w 4d         66  %    HC/AC:      1.12        1.05 -
AC:      235.1  mm     G. Age:  27w 6d         59  %    FL/BPD:     66.9   %    71 - 87
FL:       47.3  mm     G. Age:  25w 6d          6  %    FL/AC:      20.1   %    20 - 24
HUM:      45.2  mm     G. Age:  26w 5d         35  %

Est. FW:    2004  gm      2 lb 5 oz     52  %
Gestational Age

LMP:           27w 2d        Date:  09/27/17                 EDD:   07/04/18
U/S Today:     27w 5d                                        EDD:   07/01/18
Best:          27w 2d     Det. By:  LMP  (09/27/17)          EDD:   07/04/18
Anatomy

Cranium:               Appears normal         Aortic Arch:            Appears normal
Cavum:                 Appears normal         Ductal Arch:            Appears normal
Ventricles:            Appears normal         Diaphragm:              Appears normal
Choroid Plexus:        Appears normal         Stomach:                Appears normal, left
sided
Cerebellum:            Appears normal         Abdomen:                Appears normal
Posterior Fossa:       Appears normal         Abdominal Wall:         Appears nml (cord
insert, abd wall)
Nuchal Fold:           Previously seen        Cord Vessels:           Appears normal (3
vessel cord)
Face:                  Appears normal         Kidneys:                Appear normal
(orbits and profile)
Lips:                  Appears normal         Bladder:                Appears normal
Thoracic:              Appears normal         Spine:                  Previously seen
Heart:                 Appears normal         Upper Extremities:      Previously seen
(4CH, axis, and
situs)
RVOT:                  Appears normal         Lower Extremities:      Previously seen
LVOT:                  Appears normal

Other:  LT Heel previously visualized. Nasal bone visualized. Technically
difficult due to maternal habitus and fetal position.
Cervix Uterus Adnexa

Cervix
Not visualized (advanced GA >07wks)

Uterus
No abnormality visualized.

Left Ovary
Within normal limits.

Right Ovary
Within normal limits.

Cul De Sac:   No free fluid seen.

Adnexa:       No abnormality visualized.
Impression

Amniotic fluid is normal and good fetal activity is seen. Fetal
growth is appropriate for gestational age. Cardiac anatomy
appears normal.
Recommendations
Follow-up scans as clinically indicated.

## 2019-03-19 ENCOUNTER — Other Ambulatory Visit: Payer: Self-pay

## 2019-03-19 ENCOUNTER — Emergency Department (HOSPITAL_COMMUNITY)
Admission: EM | Admit: 2019-03-19 | Discharge: 2019-03-20 | Disposition: A | Discharge status: Home / Self Care | Payer: Medicaid Other | Attending: Emergency Medicine | Admitting: Emergency Medicine

## 2019-03-19 ENCOUNTER — Encounter (HOSPITAL_COMMUNITY): Payer: Self-pay | Admitting: *Deleted

## 2019-03-19 DIAGNOSIS — W540XXA Bitten by dog, initial encounter: Secondary | ICD-10-CM | POA: Insufficient documentation

## 2019-03-19 DIAGNOSIS — S61532A Puncture wound without foreign body of left wrist, initial encounter: Secondary | ICD-10-CM | POA: Insufficient documentation

## 2019-03-19 DIAGNOSIS — Y9389 Activity, other specified: Secondary | ICD-10-CM | POA: Insufficient documentation

## 2019-03-19 DIAGNOSIS — F17210 Nicotine dependence, cigarettes, uncomplicated: Secondary | ICD-10-CM | POA: Insufficient documentation

## 2019-03-19 DIAGNOSIS — Z23 Encounter for immunization: Secondary | ICD-10-CM | POA: Insufficient documentation

## 2019-03-19 DIAGNOSIS — Y999 Unspecified external cause status: Secondary | ICD-10-CM | POA: Insufficient documentation

## 2019-03-19 DIAGNOSIS — Z203 Contact with and (suspected) exposure to rabies: Secondary | ICD-10-CM | POA: Insufficient documentation

## 2019-03-19 DIAGNOSIS — Y92015 Private garage of single-family (private) house as the place of occurrence of the external cause: Secondary | ICD-10-CM | POA: Insufficient documentation

## 2019-03-19 NOTE — ED Triage Notes (Signed)
Pt reports dog bite to left forearm. 4 puncture wounds noted, bleeding controlled. Unsure if dog is current on vaccines.

## 2019-03-20 MED ORDER — RABIES IMMUNE GLOBULIN 150 UNIT/ML IM INJ
1800.0000 [IU] | INJECTION | Freq: Once | INTRAMUSCULAR | Status: AC
  Administered 2019-03-20: 1800 [IU] via INTRAMUSCULAR
  Filled 2019-03-20: qty 12

## 2019-03-20 MED ORDER — AMOXICILLIN-POT CLAVULANATE 875-125 MG PO TABS: 1.0000 | ORAL_TABLET | Freq: Two times a day (BID) | ORAL | 0 refills | Status: DC

## 2019-03-20 MED ORDER — AMOXICILLIN-POT CLAVULANATE 875-125 MG PO TABS
1.0000 | ORAL_TABLET | Freq: Once | ORAL | Status: AC
  Administered 2019-03-20: 1 via ORAL
  Filled 2019-03-20: qty 1

## 2019-03-20 MED ORDER — RABIES VACCINE, PCEC IM SUSR
1.0000 mL | Freq: Once | INTRAMUSCULAR | Status: AC
  Administered 2019-03-20: 1 mL via INTRAMUSCULAR
  Filled 2019-03-20: qty 1

## 2019-03-20 MED ORDER — TETANUS-DIPHTH-ACELL PERTUSSIS 5-2.5-18.5 LF-MCG/0.5 IM SUSP
0.5000 mL | Freq: Once | INTRAMUSCULAR | Status: AC
  Administered 2019-03-20: 0.5 mL via INTRAMUSCULAR
  Filled 2019-03-20: qty 0.5

## 2019-03-20 NOTE — Discharge Instructions (Addendum)
Take the prescribed medication as directed. You will need to continue your rabies series at urgent care.  If you do not complete series, vaccinations will not be fully effective Dose 1-- given today Dose 2-- due 7/22 Dose 3-- due 7/26 Dose 4-- due 8/2 Keep wounds clean with soap and warm water. Return to the ED for new or worsening symptoms.
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
## 2019-10-03 ENCOUNTER — Ambulatory Visit: Payer: Self-pay | Admitting: Obstetrics and Gynecology

## 2019-10-25 ENCOUNTER — Ambulatory Visit: Payer: Self-pay | Admitting: Obstetrics & Gynecology

## 2019-10-25 DIAGNOSIS — Z01419 Encounter for gynecological examination (general) (routine) without abnormal findings: Secondary | ICD-10-CM

## 2020-04-23 ENCOUNTER — Ambulatory Visit: Payer: Self-pay | Admitting: Obstetrics and Gynecology

## 2021-02-07 ENCOUNTER — Encounter (HOSPITAL_COMMUNITY): Payer: Self-pay

## 2021-02-07 ENCOUNTER — Inpatient Hospital Stay (HOSPITAL_COMMUNITY): Payer: Medicaid Other

## 2021-02-07 ENCOUNTER — Other Ambulatory Visit: Payer: Self-pay

## 2021-02-07 ENCOUNTER — Inpatient Hospital Stay (HOSPITAL_COMMUNITY)
Admission: EM | Admit: 2021-02-07 | Discharge: 2021-02-08 | Disposition: A | Discharge status: Home / Self Care | Payer: Medicaid Other | Attending: Obstetrics and Gynecology | Admitting: Obstetrics and Gynecology

## 2021-02-07 DIAGNOSIS — O3680X0 Pregnancy with inconclusive fetal viability, not applicable or unspecified: Secondary | ICD-10-CM

## 2021-02-07 DIAGNOSIS — Z3A00 Weeks of gestation of pregnancy not specified: Secondary | ICD-10-CM

## 2021-02-07 DIAGNOSIS — F17210 Nicotine dependence, cigarettes, uncomplicated: Secondary | ICD-10-CM | POA: Insufficient documentation

## 2021-02-07 DIAGNOSIS — O26899 Other specified pregnancy related conditions, unspecified trimester: Secondary | ICD-10-CM

## 2021-02-07 DIAGNOSIS — R102 Pelvic and perineal pain: Secondary | ICD-10-CM | POA: Diagnosis not present

## 2021-02-07 DIAGNOSIS — R109 Unspecified abdominal pain: Secondary | ICD-10-CM | POA: Diagnosis present

## 2021-02-07 DIAGNOSIS — O26891 Other specified pregnancy related conditions, first trimester: Secondary | ICD-10-CM | POA: Diagnosis not present

## 2021-02-07 DIAGNOSIS — O99331 Smoking (tobacco) complicating pregnancy, first trimester: Secondary | ICD-10-CM | POA: Insufficient documentation

## 2021-02-07 LAB — CBC WITH DIFFERENTIAL/PLATELET
Abs Immature Granulocytes: 0.2 10*3/uL — ABNORMAL HIGH (ref 0.00–0.07)
Basophils Absolute: 0.1 10*3/uL (ref 0.0–0.1)
Basophils Relative: 1 %
Eosinophils Absolute: 0.4 10*3/uL (ref 0.0–0.5)
Eosinophils Relative: 3 %
HCT: 41.5 % (ref 36.0–46.0)
Hemoglobin: 13.5 g/dL (ref 12.0–15.0)
Immature Granulocytes: 2 %
Lymphocytes Relative: 21 %
Lymphs Abs: 2.7 10*3/uL (ref 0.7–4.0)
MCH: 25.5 pg — ABNORMAL LOW (ref 26.0–34.0)
MCHC: 32.5 g/dL (ref 30.0–36.0)
MCV: 78.3 fL — ABNORMAL LOW (ref 80.0–100.0)
Monocytes Absolute: 0.8 10*3/uL (ref 0.1–1.0)
Monocytes Relative: 6 %
Neutro Abs: 8.5 10*3/uL — ABNORMAL HIGH (ref 1.7–7.7)
Neutrophils Relative %: 67 %
Platelets: 427 10*3/uL — ABNORMAL HIGH (ref 150–400)
RBC: 5.3 MIL/uL — ABNORMAL HIGH (ref 3.87–5.11)
RDW: 14.1 % (ref 11.5–15.5)
WBC: 12.7 10*3/uL — ABNORMAL HIGH (ref 4.0–10.5)
nRBC: 0 % (ref 0.0–0.2)

## 2021-02-07 LAB — URINALYSIS, ROUTINE W REFLEX MICROSCOPIC
Bilirubin Urine: NEGATIVE
Glucose, UA: NEGATIVE mg/dL
Ketones, ur: NEGATIVE mg/dL
Leukocytes,Ua: NEGATIVE
Nitrite: NEGATIVE
Protein, ur: NEGATIVE mg/dL
Specific Gravity, Urine: 1.025 (ref 1.005–1.030)
pH: 6 (ref 5.0–8.0)

## 2021-02-07 LAB — COMPREHENSIVE METABOLIC PANEL
ALT: 19 U/L (ref 0–44)
AST: 18 U/L (ref 15–41)
Albumin: 3.6 g/dL (ref 3.5–5.0)
Alkaline Phosphatase: 44 U/L (ref 38–126)
Anion gap: 10 (ref 5–15)
BUN: 7 mg/dL (ref 6–20)
CO2: 24 mmol/L (ref 22–32)
Calcium: 9.1 mg/dL (ref 8.9–10.3)
Chloride: 105 mmol/L (ref 98–111)
Creatinine, Ser: 0.64 mg/dL (ref 0.44–1.00)
GFR, Estimated: >60 mL/min (ref >60)
Glucose, Bld: 112 mg/dL — ABNORMAL HIGH (ref 70–99)
Potassium: 3.4 mmol/L — ABNORMAL LOW (ref 3.5–5.1)
Sodium: 139 mmol/L (ref 135–145)
Total Bilirubin: 0.6 mg/dL (ref 0.3–1.2)
Total Protein: 7.1 g/dL (ref 6.5–8.1)

## 2021-02-07 LAB — I-STAT BETA HCG BLOOD, ED (MC, WL, AP ONLY): I-stat hCG, quantitative: 1624.9 m[IU]/mL — ABNORMAL HIGH (ref <5)

## 2021-02-07 LAB — LIPASE, BLOOD: Lipase: 33 U/L (ref 11–51)

## 2021-02-07 NOTE — ED Provider Notes (Signed)
Emergency Medicine Provider Triage Evaluation Note  Jennifer Cohen , a 30 y.o. female  was evaluated in triage.  Pt complains of abdominal cramping.  Diffuse however worse to lower abdomen. Last bowel movement yesterday however feels constipated.  No urinary complaints.  LMP 1 month ago.  Unsure if she would be pregnant.  No vaginal discharge.    Review of Systems  Positive: Abdominal cramping Negative: Vaginal discharge, vaginal bleeding.  Physical Exam  There were no vitals taken for this visit. Gen:   Awake, no distress   Resp:  Normal effort  Abd:  Soft nontender MSK:   Moves extremities without difficulty  Other:    Medical Decision Making  Medically screening exam initiated at 6:19 PM.  Appropriate orders placed.  Jennifer Cohen was informed that the remainder of the evaluation will be completed by another provider, this initial triage assessment does not replace that evaluation, and the importance of remaining in the ED until their evaluation is complete.  Abd cramping   Jennifer Cohen A, PA-C 02/07/21 1820    Jennifer Dibbles, MD 02/07/21 2325

## 2021-02-07 NOTE — MAU Note (Signed)
..  Jennifer Cohen is a 30 y.o. at [redacted]w[redacted]d here in MAU reporting: lower abdominal cramping and back pain. Denies vaginal bleeding or abnormal discharge.  LMP: 12/30/2020 Onset of complaint: last week Pain score: 7/10 Vitals:   02/07/21 2057 02/07/21 2141  BP: 126/66 115/77  Pulse: 73 77  Resp: 18 17  Temp: 97.7 F (36.5 C) 98.5 F (36.9 C)  SpO2: 100% 100%

## 2021-02-07 NOTE — ED Triage Notes (Signed)
Pt arrives POV for eval of lower back pain/cramping x 1 week. Pt reports LMP 01/03/21. Denies Vag bleeding/discharge, Endorses nausea. Reports positive home UPT.
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
## 2021-02-07 NOTE — ED Provider Notes (Signed)
Notified by nursing that patient's pregnancy test is positive.  Discussed results with patient.  Consult with Hilda Lias, APP with MAU.  Agrees to except patient transfer. Patient stable.  Discussed with patient.  She is agreeable.     Shigeru Lampert A, PA-C 02/07/21 2100    Linwood Dibbles, MD 02/07/21 2325

## 2021-02-10 ENCOUNTER — Inpatient Hospital Stay (HOSPITAL_COMMUNITY)
Admission: AD | Admit: 2021-02-10 | Discharge: 2021-02-10 | Disposition: A | Discharge status: Home / Self Care | Payer: Medicaid Other | Attending: Obstetrics and Gynecology | Admitting: Obstetrics and Gynecology

## 2021-02-10 ENCOUNTER — Other Ambulatory Visit: Payer: Self-pay

## 2021-02-10 DIAGNOSIS — Z3201 Encounter for pregnancy test, result positive: Secondary | ICD-10-CM | POA: Insufficient documentation

## 2021-02-10 LAB — HCG, QUANTITATIVE, PREGNANCY: hCG, Beta Chain, Quant, S: 3507 m[IU]/mL — ABNORMAL HIGH (ref <5)

## 2021-02-10 NOTE — Discharge Instructions (Signed)
Follow up with OBGYN for initial prenatal appt at 8-[redacted] weeks gestation

## 2021-02-10 NOTE — MAU Note (Signed)
Here for repeat BHCG. Some lower back pain. No bleeding. Lab in Triage to collect lab. Pt understands to wait for results and not leave the hospital

## 2021-02-12 ENCOUNTER — Inpatient Hospital Stay (HOSPITAL_COMMUNITY): Payer: Medicaid Other

## 2021-02-12 ENCOUNTER — Ambulatory Visit (INDEPENDENT_AMBULATORY_CARE_PROVIDER_SITE_OTHER): Payer: Medicaid Other | Admitting: *Deleted

## 2021-02-12 ENCOUNTER — Other Ambulatory Visit: Payer: Self-pay | Admitting: Obstetrics and Gynecology

## 2021-02-12 ENCOUNTER — Inpatient Hospital Stay (HOSPITAL_COMMUNITY)
Admission: AD | Admit: 2021-02-12 | Discharge: 2021-02-12 | Disposition: A | Discharge status: Home / Self Care | Payer: Medicaid Other | Attending: Obstetrics and Gynecology | Admitting: Obstetrics and Gynecology

## 2021-02-12 ENCOUNTER — Other Ambulatory Visit: Payer: Self-pay | Admitting: Family Medicine

## 2021-02-12 ENCOUNTER — Other Ambulatory Visit: Payer: Self-pay

## 2021-02-12 VITALS — BP 120/84 | HR 81 | Ht 60.0 in | Wt 188.1 lb

## 2021-02-12 DIAGNOSIS — Z3A01 Less than 8 weeks gestation of pregnancy: Secondary | ICD-10-CM | POA: Insufficient documentation

## 2021-02-12 DIAGNOSIS — O99331 Smoking (tobacco) complicating pregnancy, first trimester: Secondary | ICD-10-CM | POA: Diagnosis not present

## 2021-02-12 DIAGNOSIS — O26891 Other specified pregnancy related conditions, first trimester: Secondary | ICD-10-CM | POA: Insufficient documentation

## 2021-02-12 DIAGNOSIS — O99891 Other specified diseases and conditions complicating pregnancy: Secondary | ICD-10-CM

## 2021-02-12 DIAGNOSIS — O3680X0 Pregnancy with inconclusive fetal viability, not applicable or unspecified: Secondary | ICD-10-CM

## 2021-02-12 DIAGNOSIS — F17210 Nicotine dependence, cigarettes, uncomplicated: Secondary | ICD-10-CM | POA: Diagnosis not present

## 2021-02-12 DIAGNOSIS — M545 Low back pain, unspecified: Secondary | ICD-10-CM | POA: Insufficient documentation

## 2021-02-12 DIAGNOSIS — N939 Abnormal uterine and vaginal bleeding, unspecified: Secondary | ICD-10-CM

## 2021-02-12 LAB — COMPREHENSIVE METABOLIC PANEL
ALT: 17 U/L (ref 0–44)
AST: 16 U/L (ref 15–41)
Albumin: 3.6 g/dL (ref 3.5–5.0)
Alkaline Phosphatase: 42 U/L (ref 38–126)
Anion gap: 8 (ref 5–15)
BUN: 7 mg/dL (ref 6–20)
CO2: 23 mmol/L (ref 22–32)
Calcium: 8.7 mg/dL — ABNORMAL LOW (ref 8.9–10.3)
Chloride: 106 mmol/L (ref 98–111)
Creatinine, Ser: 0.55 mg/dL (ref 0.44–1.00)
GFR, Estimated: >60 mL/min (ref >60)
Glucose, Bld: 81 mg/dL (ref 70–99)
Potassium: 3.2 mmol/L — ABNORMAL LOW (ref 3.5–5.1)
Sodium: 137 mmol/L (ref 135–145)
Total Bilirubin: 0.4 mg/dL (ref 0.3–1.2)
Total Protein: 6.9 g/dL (ref 6.5–8.1)

## 2021-02-12 LAB — WET PREP, GENITAL
Trich, Wet Prep: NONE SEEN
Yeast Wet Prep HPF POC: NONE SEEN

## 2021-02-12 LAB — CBC
HCT: 38.8 % (ref 36.0–46.0)
Hemoglobin: 12.9 g/dL (ref 12.0–15.0)
MCH: 25.7 pg — ABNORMAL LOW (ref 26.0–34.0)
MCHC: 33.2 g/dL (ref 30.0–36.0)
MCV: 77.4 fL — ABNORMAL LOW (ref 80.0–100.0)
Platelets: 379 10*3/uL (ref 150–400)
RBC: 5.01 MIL/uL (ref 3.87–5.11)
RDW: 14.2 % (ref 11.5–15.5)
WBC: 13.1 10*3/uL — ABNORMAL HIGH (ref 4.0–10.5)
nRBC: 0 % (ref 0.0–0.2)

## 2021-02-12 LAB — HCG, QUANTITATIVE, PREGNANCY: hCG, Beta Chain, Quant, S: 7793 m[IU]/mL — ABNORMAL HIGH (ref <5)

## 2021-02-12 LAB — BETA HCG QUANT (REF LAB): hCG Quant: 3729 m[IU]/mL

## 2021-02-12 MED ORDER — CYCLOBENZAPRINE HCL 5 MG PO TABS: 5.0000 mg | ORAL_TABLET | Freq: Three times a day (TID) | ORAL | 0 refills | Status: DC | PRN

## 2021-02-12 NOTE — MAU Note (Addendum)
PT SAYS SHE HAS BACK PAIN- STARTED LAST WEEK- WORSE TODAY  NO APPOINTMENT FOR [facility] YET.  NO MEDS FOR PAIN - NOW 8.  WAS HERE Thursday NIGHT- FOR BACK PAIN- U/S AND LABS   WAS HERE SAT NIGHT - FOR LABS - AND STILL HAD BACK PAIN  SPOTTING WHEN SHE WIPES AT 5PM.

## 2021-02-12 NOTE — Discharge Instructions (Signed)
[facility]   [facility]             [address], [place], [place] [address] [phone]  [facility]                                                             [address], [address], [place], [place], [address] [phone]  [facility]                                    [address], [address], [place], [place], [address] [phone]  [facility] [place] [address], [address], [place], [place], [address] [phone]  [facility]                                 [address], [place], [place], [address] [phone]  [facility]                                    [address], [place], [place], [address] [phone]  [facility] [address], [address], [place], [place], [address]                              [facility] [address], [address], [place], [place], [address] [phone]  [facility]         Phone: [phone]  [facility]      Phone: [phone]   [facility]      Phone: [phone]  [facility]         Phone: [phone]   [facility]    Phone: [phone]  [facility]      Phone: [phone]  [facility]     Phone: [phone]  [facility]        Phone: [phone]  [facility]      Phone: [phone]  

## 2021-02-12 NOTE — Progress Notes (Signed)
Pregnancy of unknown location.  Quants have risen appropriately [phone]  [place] ordered for viability.   Venia Carbon I, NP 02/12/2021 8:16 AM

## 2021-02-12 NOTE — Progress Notes (Signed)
Stat bhcg of 3729 received today. Reviewed result and patient history and assessment from today. Instructed repeat stat bhcg 2 days and follow strict ectopic precautions. I called [facility] and reviewed results and recommendations with [place]. I explained registrar will contact her with appointment . She voices understanding. Doratha Mcswain,RN
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
## 2021-02-12 NOTE — Progress Notes (Signed)
Here for stat bhcg. Denies cramping in pelvis andl lower back pain =7. States is same as when she was seen in MAU 6/9. Denies bleeding. Explained we will draw stat bhcg and have her leave office. She will be called with results in about 2 hours after results received and reviewed by provider. Also informed her about [facility] services if needed . She voices understanding.   Mesiah Manzo,RN

## 2021-02-12 NOTE — Progress Notes (Signed)
Chart reviewed for nurse visit. Agree with plan of care.   Abnormal rise but have seen major discrepancies between inpatient/outpatient labs and only have two values, will get an additional level before determining if pregnancy is abnormal. Stressed ectopic precautions.  Venora Maples, MD 02/12/21 2:25 PM

## 2021-02-13 LAB — GC/CHLAMYDIA PROBE AMP (~~LOC~~) NOT AT ARMC
Chlamydia: NEGATIVE
Comment: NEGATIVE
Comment: NORMAL
Neisseria Gonorrhea: NEGATIVE

## 2021-02-14 ENCOUNTER — Ambulatory Visit: Payer: Medicaid Other

## 2021-02-14 ENCOUNTER — Encounter: Payer: Self-pay | Admitting: Family Medicine

## 2021-02-19 ENCOUNTER — Inpatient Hospital Stay: Admission: RE | Admit: 2021-02-19 | Payer: Medicaid Other | Source: Ambulatory Visit

## 2021-02-26 ENCOUNTER — Inpatient Hospital Stay (HOSPITAL_COMMUNITY)
Admission: AD | Admit: 2021-02-26 | Discharge: 2021-02-27 | Disposition: A | Discharge status: Home / Self Care | Payer: Medicaid Other | Attending: Obstetrics and Gynecology | Admitting: Obstetrics and Gynecology

## 2021-02-26 ENCOUNTER — Other Ambulatory Visit: Payer: Self-pay

## 2021-02-26 ENCOUNTER — Inpatient Hospital Stay (HOSPITAL_COMMUNITY): Payer: Medicaid Other

## 2021-02-26 DIAGNOSIS — Z3A01 Less than 8 weeks gestation of pregnancy: Secondary | ICD-10-CM

## 2021-02-26 DIAGNOSIS — O99331 Smoking (tobacco) complicating pregnancy, first trimester: Secondary | ICD-10-CM | POA: Diagnosis not present

## 2021-02-26 DIAGNOSIS — R109 Unspecified abdominal pain: Secondary | ICD-10-CM | POA: Insufficient documentation

## 2021-02-26 DIAGNOSIS — R1030 Lower abdominal pain, unspecified: Secondary | ICD-10-CM | POA: Diagnosis not present

## 2021-02-26 DIAGNOSIS — Z3A08 8 weeks gestation of pregnancy: Secondary | ICD-10-CM | POA: Diagnosis not present

## 2021-02-26 DIAGNOSIS — R110 Nausea: Secondary | ICD-10-CM | POA: Diagnosis not present

## 2021-02-26 DIAGNOSIS — O26891 Other specified pregnancy related conditions, first trimester: Secondary | ICD-10-CM | POA: Diagnosis not present

## 2021-02-26 DIAGNOSIS — O219 Vomiting of pregnancy, unspecified: Secondary | ICD-10-CM

## 2021-02-26 DIAGNOSIS — O210 Mild hyperemesis gravidarum: Secondary | ICD-10-CM | POA: Diagnosis not present

## 2021-02-26 NOTE — MAU Note (Signed)
..  Jennifer Cohen is a 30 y.o. at [redacted]w[redacted]d here in MAU reporting: Nausea no vomiting. Reports cramping all day off and on. Reports vaginal bleeding earlier but none now.  LMP:   Pain score: 8/10 Vitals:   02/26/21 2320  BP: 135/78  Pulse: 91  Resp: 18  Temp: 98.7 F (37.1 C)  SpO2: 99%      Lab orders placed from triage: UA
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
## 2021-02-27 LAB — URINALYSIS, ROUTINE W REFLEX MICROSCOPIC
Bacteria, UA: NONE SEEN
Bilirubin Urine: NEGATIVE
Glucose, UA: NEGATIVE mg/dL
Ketones, ur: NEGATIVE mg/dL
Leukocytes,Ua: NEGATIVE
Nitrite: NEGATIVE
Protein, ur: NEGATIVE mg/dL
Specific Gravity, Urine: 1.021 (ref 1.005–1.030)
pH: 5 (ref 5.0–8.0)

## 2021-02-27 MED ORDER — PROMETHAZINE HCL 25 MG PO TABS: 25.0000 mg | ORAL_TABLET | Freq: Four times a day (QID) | ORAL | 2 refills | Status: DC | PRN

## 2021-03-06 ENCOUNTER — Other Ambulatory Visit: Payer: Self-pay

## 2021-03-06 ENCOUNTER — Encounter (HOSPITAL_COMMUNITY): Payer: Self-pay | Admitting: Obstetrics & Gynecology

## 2021-03-06 ENCOUNTER — Inpatient Hospital Stay (HOSPITAL_COMMUNITY)
Admission: AD | Admit: 2021-03-06 | Discharge: 2021-03-06 | Disposition: A | Discharge status: Home / Self Care | Payer: Medicaid Other | Attending: Obstetrics & Gynecology | Admitting: Obstetrics & Gynecology

## 2021-03-06 DIAGNOSIS — R300 Dysuria: Secondary | ICD-10-CM

## 2021-03-06 DIAGNOSIS — O2341 Unspecified infection of urinary tract in pregnancy, first trimester: Secondary | ICD-10-CM | POA: Diagnosis not present

## 2021-03-06 DIAGNOSIS — O26891 Other specified pregnancy related conditions, first trimester: Secondary | ICD-10-CM | POA: Insufficient documentation

## 2021-03-06 DIAGNOSIS — Z87891 Personal history of nicotine dependence: Secondary | ICD-10-CM | POA: Diagnosis not present

## 2021-03-06 DIAGNOSIS — R102 Pelvic and perineal pain: Secondary | ICD-10-CM | POA: Insufficient documentation

## 2021-03-06 DIAGNOSIS — F17210 Nicotine dependence, cigarettes, uncomplicated: Secondary | ICD-10-CM | POA: Diagnosis not present

## 2021-03-06 DIAGNOSIS — O209 Hemorrhage in early pregnancy, unspecified: Secondary | ICD-10-CM | POA: Insufficient documentation

## 2021-03-06 DIAGNOSIS — O26851 Spotting complicating pregnancy, first trimester: Secondary | ICD-10-CM

## 2021-03-06 DIAGNOSIS — Z3A09 9 weeks gestation of pregnancy: Secondary | ICD-10-CM | POA: Insufficient documentation

## 2021-03-06 DIAGNOSIS — O99331 Smoking (tobacco) complicating pregnancy, first trimester: Secondary | ICD-10-CM | POA: Diagnosis not present

## 2021-03-06 DIAGNOSIS — O2340 Unspecified infection of urinary tract in pregnancy, unspecified trimester: Secondary | ICD-10-CM | POA: Diagnosis present

## 2021-03-06 LAB — URINALYSIS, ROUTINE W REFLEX MICROSCOPIC
Bilirubin Urine: NEGATIVE
Glucose, UA: NEGATIVE mg/dL
Hgb urine dipstick: NEGATIVE
Ketones, ur: NEGATIVE mg/dL
Nitrite: POSITIVE — AB
Protein, ur: NEGATIVE mg/dL
Specific Gravity, Urine: 1.023 (ref 1.005–1.030)
WBC, UA: >50 WBC/hpf — ABNORMAL HIGH (ref 0–5)
pH: 5 (ref 5.0–8.0)

## 2021-03-06 MED ORDER — CEPHALEXIN 500 MG PO CAPS: 500.0000 mg | ORAL_CAPSULE | Freq: Four times a day (QID) | ORAL | 2 refills | Status: DC

## 2021-03-06 NOTE — MAU Note (Signed)
Saw some pink spotting earlier tonight and "stings" some with urination. Feels vaginal pressure, esp when standing
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
## 2021-03-07 DIAGNOSIS — O2340 Unspecified infection of urinary tract in pregnancy, unspecified trimester: Secondary | ICD-10-CM | POA: Diagnosis present

## 2021-03-09 LAB — CULTURE, OB URINE: Culture: >=100000 — AB

## 2021-03-18 ENCOUNTER — Telehealth (INDEPENDENT_AMBULATORY_CARE_PROVIDER_SITE_OTHER): Payer: Self-pay

## 2021-03-18 DIAGNOSIS — O0990 Supervision of high risk pregnancy, unspecified, unspecified trimester: Secondary | ICD-10-CM

## 2021-03-18 NOTE — Progress Notes (Signed)
Called pt @ [phone] as Epic was down.  Unable to VM due to message stating "please mailbox number."    Attempted to call pt @ 0845 and received the same message as above.  Pt will need to reschedule her appt.  Front office notified to call pt to reschedule.    Leonette Nutting

## 2021-04-03 ENCOUNTER — Ambulatory Visit (INDEPENDENT_AMBULATORY_CARE_PROVIDER_SITE_OTHER): Payer: Medicaid Other | Admitting: *Deleted

## 2021-04-03 ENCOUNTER — Telehealth: Payer: Self-pay | Admitting: *Deleted

## 2021-04-03 DIAGNOSIS — O0990 Supervision of high risk pregnancy, unspecified, unspecified trimester: Secondary | ICD-10-CM | POA: Insufficient documentation

## 2021-04-03 DIAGNOSIS — O2341 Unspecified infection of urinary tract in pregnancy, first trimester: Secondary | ICD-10-CM

## 2021-04-03 DIAGNOSIS — Z3481 Encounter for supervision of other normal pregnancy, first trimester: Secondary | ICD-10-CM

## 2021-04-03 MED ORDER — PREPLUS 27-1 MG PO TABS: 1.0000 | ORAL_TABLET | Freq: Every day | ORAL | 13 refills | Status: DC

## 2021-04-03 MED ORDER — BLOOD PRESSURE KIT DEVI: 1.0000 | 0 refills | Status: DC

## 2021-04-03 MED ORDER — GOJJI WEIGHT SCALE MISC: 1.0000 | 0 refills | Status: DC

## 2021-04-03 NOTE — Progress Notes (Signed)
New OB Intake  I connected with  Jennifer Cohen on 04/03/21 at  9:00 AM EDT by telephone Video Visit and verified that I am speaking with the correct person using two identifiers. Nurse is located at [facility] and pt is located at home.  I discussed the limitations, risks, security and privacy concerns of performing an evaluation and management service by telephone and the availability of in person appointments. I also discussed with the patient that there may be a patient responsible charge related to this service. The patient expressed understanding and agreed to proceed.  I explained I am completing New OB Intake today. We discussed her EDD of 10/06/21 that is based on LMP of 12/30/20. Pt is G8/P4. I reviewed her allergies, medications, Medical/Surgical/OB history, and appropriate screenings. I informed her of [facility] services. Based on history, this is a/an  pregnancy complicated by hx of preterm delivery, PPROM and CS x 1 with VBAC x 3  .   Patient Active Problem List   Diagnosis Date Noted   UTI (urinary tract infection) during pregnancy 03/07/2021   PROM (premature rupture of membranes) 06/10/2018   History of shoulder dystocia in prior pregnancy 05/18/2018   Gestational diabetes mellitus (GDM) in third trimester 04/13/2018   HGSIL (high grade squamous intraepithelial lesion) on Pap smear of cervix 03/16/2018   Constipation during pregnancy 03/16/2018   History of VBAC 01/19/2018   Supervision of high risk pregnancy, antepartum 12/15/2017   Uterine contractions during pregnancy 04/07/2014    Concerns addressed today  Delivery Plans:  Plans to deliver at [facility].   MyChart/Babyscripts MyChart access verified. I explained pt will have some visits in office and some virtually. Babyscripts instructions given and order placed. Patient verifies receipt of registration text/e-mail. Account successfully created and app downloaded.  Blood Pressure Cuff  Blood pressure cuff ordered for patient  to pick-up from [employer]. Explained after first prenatal appt pt will check weekly and document in Babyscripts.  Weight scale: Patient    have weight scale. Weight scale ordered   Anatomy US Explained first scheduled [place] will be around 19 weeks. Anatomy US scheduled for TBD at TBD. Pt notified to arrive at TBD.  Labs Discussed Avelina Laine genetic screening with patient. Would like both Panorama and Horizon drawn at new OB visit. Routine prenatal labs needed.  Covid Vaccine Patient has not covid vaccine.   Social Determinants of Health Food Insecurity: Patient denies food insecurity. WIC Referral: Patient is not interested in referral to [facility].  Transportation: Patient denies transportation needs. Childcare: Discussed no children allowed at ultrasound appointments. Offered childcare services; patient declines childcare services at this time.  First visit review I reviewed new OB appt with pt. I explained she will have a pelvic exam, ob bloodwork with genetic screening, and PAP smear. Explained pt will be seen by Dr. Donavan Foil at first visit; encounter routed to appropriate provider. Explained that patient will be seen by pregnancy navigator following visit with provider. Patient declined pregnancy navigator.  [facility] information placed in AVS. Patient declined [facility] for positive anxiety screen.  Harrel Lemon, RN 04/03/2021  9:37 AM

## 2021-04-08 NOTE — Telephone Encounter (Signed)
04/03/21 Patient returned call. OB intake completed.

## 2021-04-08 NOTE — Telephone Encounter (Signed)
OB Intake completed 04/03/21

## 2021-04-10 ENCOUNTER — Ambulatory Visit (INDEPENDENT_AMBULATORY_CARE_PROVIDER_SITE_OTHER): Payer: Medicaid Other | Admitting: Obstetrics and Gynecology

## 2021-04-10 ENCOUNTER — Other Ambulatory Visit: Payer: Self-pay

## 2021-04-10 ENCOUNTER — Encounter: Payer: Self-pay | Admitting: Obstetrics and Gynecology

## 2021-04-10 ENCOUNTER — Other Ambulatory Visit (HOSPITAL_COMMUNITY)
Admission: RE | Admit: 2021-04-10 | Discharge: 2021-04-10 | Disposition: A | Discharge status: Home / Self Care | Payer: Medicaid Other | Source: Ambulatory Visit | Attending: Obstetrics and Gynecology | Admitting: Obstetrics and Gynecology

## 2021-04-10 VITALS — BP 125/85 | HR 88 | Wt 200.0 lb

## 2021-04-10 DIAGNOSIS — O09892 Supervision of other high risk pregnancies, second trimester: Secondary | ICD-10-CM | POA: Insufficient documentation

## 2021-04-10 DIAGNOSIS — Z98891 History of uterine scar from previous surgery: Secondary | ICD-10-CM

## 2021-04-10 DIAGNOSIS — O0990 Supervision of high risk pregnancy, unspecified, unspecified trimester: Secondary | ICD-10-CM | POA: Diagnosis present

## 2021-04-10 DIAGNOSIS — Z8632 Personal history of gestational diabetes: Secondary | ICD-10-CM

## 2021-04-10 DIAGNOSIS — Z3A14 14 weeks gestation of pregnancy: Secondary | ICD-10-CM | POA: Insufficient documentation

## 2021-04-10 DIAGNOSIS — Z3480 Encounter for supervision of other normal pregnancy, unspecified trimester: Secondary | ICD-10-CM

## 2021-04-10 DIAGNOSIS — O09292 Supervision of pregnancy with other poor reproductive or obstetric history, second trimester: Secondary | ICD-10-CM | POA: Insufficient documentation

## 2021-04-10 DIAGNOSIS — R87613 High grade squamous intraepithelial lesion on cytologic smear of cervix (HGSIL): Secondary | ICD-10-CM

## 2021-04-10 DIAGNOSIS — Z8759 Personal history of other complications of pregnancy, childbirth and the puerperium: Secondary | ICD-10-CM

## 2021-04-10 HISTORY — DX: Supervision of other high risk pregnancies, second trimester: O09.892

## 2021-04-10 HISTORY — DX: History of uterine scar from previous surgery: Z98.891

## 2021-04-10 MED ORDER — ASPIRIN 81 MG PO CHEW: 81.0000 mg | CHEWABLE_TABLET | Freq: Every day | ORAL | 6 refills | Status: DC
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
## 2021-04-12 LAB — OBSTETRIC PANEL, INCLUDING HIV
Antibody Screen: NEGATIVE
Basophils Absolute: 0.1 10*3/uL (ref 0.0–0.2)
Basos: 1 %
EOS (ABSOLUTE): 0.4 10*3/uL (ref 0.0–0.4)
Eos: 3 %
HIV Screen 4th Generation wRfx: NONREACTIVE
Hematocrit: 36.5 % (ref 34.0–46.6)
Hemoglobin: 11.9 g/dL (ref 11.1–15.9)
Hepatitis B Surface Ag: NEGATIVE
Immature Grans (Abs): 0.2 10*3/uL — ABNORMAL HIGH (ref 0.0–0.1)
Immature Granulocytes: 2 %
Lymphocytes Absolute: 2 10*3/uL (ref 0.7–3.1)
Lymphs: 14 %
MCH: 25.8 pg — ABNORMAL LOW (ref 26.6–33.0)
MCHC: 32.6 g/dL (ref 31.5–35.7)
MCV: 79 fL (ref 79–97)
Monocytes Absolute: 0.7 10*3/uL (ref 0.1–0.9)
Monocytes: 5 %
Neutrophils Absolute: 10.9 10*3/uL — ABNORMAL HIGH (ref 1.4–7.0)
Neutrophils: 75 %
Platelets: 417 10*3/uL (ref 150–450)
RBC: 4.61 x10E6/uL (ref 3.77–5.28)
RDW: 13.9 % (ref 11.7–15.4)
RPR Ser Ql: NONREACTIVE
Rh Factor: POSITIVE
Rubella Antibodies, IGG: 1.11 index (ref >0.99)
WBC: 14.3 10*3/uL — ABNORMAL HIGH (ref 3.4–10.8)

## 2021-04-12 LAB — CERVICOVAGINAL ANCILLARY ONLY
Bacterial Vaginitis (gardnerella): NEGATIVE
Candida Glabrata: POSITIVE — AB
Candida Vaginitis: POSITIVE — AB
Chlamydia: NEGATIVE
Comment: NEGATIVE
Comment: NEGATIVE
Comment: NEGATIVE
Comment: NEGATIVE
Comment: NEGATIVE
Comment: NORMAL
Neisseria Gonorrhea: NEGATIVE
Trichomonas: POSITIVE — AB

## 2021-04-12 LAB — HEMOGLOBIN A1C
Est. average glucose Bld gHb Est-mCnc: 105 mg/dL
Hgb A1c MFr Bld: 5.3 % (ref 4.8–5.6)

## 2021-04-12 LAB — HEPATITIS C ANTIBODY: Hep C Virus Ab: <0.1 s/co ratio (ref 0.0–0.9)

## 2021-04-15 ENCOUNTER — Other Ambulatory Visit: Payer: Self-pay | Admitting: Obstetrics and Gynecology

## 2021-04-15 MED ORDER — METRONIDAZOLE 500 MG PO TABS: 500.0000 mg | ORAL_TABLET | Freq: Two times a day (BID) | ORAL | 0 refills | Status: DC

## 2021-04-15 MED ORDER — TERCONAZOLE 0.8 % VA CREA: 1.0000 | TOPICAL_CREAM | Freq: Every day | VAGINAL | 0 refills | Status: DC

## 2021-04-16 LAB — URINE CULTURE, OB REFLEX

## 2021-04-16 LAB — CULTURE, OB URINE

## 2021-04-17 ENCOUNTER — Encounter: Payer: Self-pay | Admitting: Obstetrics and Gynecology

## 2021-04-17 DIAGNOSIS — R8271 Bacteriuria: Secondary | ICD-10-CM | POA: Insufficient documentation

## 2021-04-17 LAB — CYTOLOGY - PAP
Comment: NEGATIVE
Diagnosis: UNDETERMINED — AB
High risk HPV: POSITIVE — AB

## 2021-04-18 ENCOUNTER — Other Ambulatory Visit: Payer: Self-pay

## 2021-04-18 DIAGNOSIS — O2340 Unspecified infection of urinary tract in pregnancy, unspecified trimester: Secondary | ICD-10-CM

## 2021-04-18 MED ORDER — AMOXICILLIN 500 MG PO CAPS: 500.0000 mg | ORAL_CAPSULE | Freq: Three times a day (TID) | ORAL | 0 refills | Status: DC

## 2021-04-18 NOTE — Progress Notes (Signed)
Rx sent as advised for UTI. Pt not ava lvm

## 2021-04-22 ENCOUNTER — Encounter: Payer: Self-pay | Admitting: Obstetrics and Gynecology

## 2021-05-08 ENCOUNTER — Encounter: Payer: Medicaid Other | Admitting: Obstetrics & Gynecology

## 2021-05-13 ENCOUNTER — Ambulatory Visit (INDEPENDENT_AMBULATORY_CARE_PROVIDER_SITE_OTHER): Payer: Medicaid Other | Admitting: Advanced Practice Midwife

## 2021-05-13 ENCOUNTER — Other Ambulatory Visit: Payer: Self-pay

## 2021-05-13 VITALS — BP 129/81 | HR 99 | Wt 203.0 lb

## 2021-05-13 DIAGNOSIS — R87613 High grade squamous intraepithelial lesion on cytologic smear of cervix (HGSIL): Secondary | ICD-10-CM

## 2021-05-13 DIAGNOSIS — O0990 Supervision of high risk pregnancy, unspecified, unspecified trimester: Secondary | ICD-10-CM

## 2021-05-13 DIAGNOSIS — O219 Vomiting of pregnancy, unspecified: Secondary | ICD-10-CM

## 2021-05-13 DIAGNOSIS — Z3A19 19 weeks gestation of pregnancy: Secondary | ICD-10-CM

## 2021-05-13 DIAGNOSIS — O09892 Supervision of other high risk pregnancies, second trimester: Secondary | ICD-10-CM

## 2021-05-13 MED ORDER — METOCLOPRAMIDE HCL 10 MG PO TABS: 10.0000 mg | ORAL_TABLET | Freq: Three times a day (TID) | ORAL | 2 refills | Status: DC | PRN

## 2021-05-13 MED ORDER — PRENATE MINI 18-0.6-0.4-350 MG PO CAPS: 1.0000 | ORAL_CAPSULE | Freq: Every day | ORAL | 11 refills | Status: DC

## 2021-05-13 NOTE — Progress Notes (Signed)
Pt request new PNV Rx - would like smaller pill.

## 2021-05-13 NOTE — Progress Notes (Signed)
   PRENATAL VISIT NOTE  Subjective:  Jennifer Cohen is a 30 y.o. T5V7616 at [redacted]w[redacted]d being seen today for ongoing prenatal care.  She is currently monitored for the following issues for this high-risk pregnancy and has Uterine contractions during pregnancy; History of VBAC; HGSIL (high grade squamous intraepithelial lesion) on Pap smear of cervix; Constipation during pregnancy; Gestational diabetes mellitus (GDM) in third trimester; History of shoulder dystocia in prior pregnancy; PROM (premature rupture of membranes); UTI (urinary tract infection) during pregnancy; Supervision of high risk pregnancy, antepartum; [redacted] weeks gestation of pregnancy; Hx of gestational diabetes in prior pregnancy, currently pregnant, second trimester; Hx of cesarean section; Hx of preterm delivery, currently pregnant, second trimester; and GBS bacteriuria on their problem list.  Patient reports  occasionally episodes of nausea and low appetite, occasional cramping .  Contractions: Not present. Vag. Bleeding: None.   . Denies leaking of fluid.   The following portions of the patient's history were reviewed and updated as appropriate: allergies, current medications, past family history, past medical history, past social history, past surgical history and problem list.   Objective:   Vitals:   05/13/21 0858  BP: 129/81  Pulse: 99  Weight: 203 lb (92.1 kg)    Fetal Status: Fetal Heart Rate (bpm): 155         General:  Alert, oriented and cooperative. Patient is in no acute distress.  Skin: Skin is warm and dry. No rash noted.   Cardiovascular: Normal heart rate noted  Respiratory: Normal respiratory effort, no problems with respiration noted  Abdomen: Soft, gravid, appropriate for gestational age.  Pain/Pressure: Absent     Pelvic: Cervical exam deferred        Extremities: Normal range of motion.     Mental Status: Normal mood and affect. Normal behavior. Normal judgment and thought content.   Assessment and  Plan:  Pregnancy: W7P7106 at [redacted]w[redacted]d 1. Supervision of high risk pregnancy, antepartum --Anticipatory guidance about next visits/weeks of pregnancy given. --Increase PO fluids for mild cramping, pt reports drinking juice only. Discussed importance of water, ways to add flavor, etc. --Next visit in 4 weeks  2. Hx of preterm delivery, currently pregnant, second trimester   3. [redacted] weeks gestation of pregnancy   4. HGSIL (high grade squamous intraepithelial lesion) on Pap smear of cervix --Colpo PP  5. Nausea and vomiting during pregnancy prior to [redacted] weeks gestation --Discussed dietary changes. Pt with nausea related to taste/smell of certain foods.  Sometimes low appetite, unable to eat.  Will try Reglan PRN, pt to notify if not improved.  - metoCLOPramide (REGLAN) 10 MG tablet; Take 1 tablet (10 mg total) by mouth every 8 (eight) hours as needed for nausea.  Dispense: 30 tablet; Refill: 2   Preterm labor symptoms and general obstetric precautions including but not limited to vaginal bleeding, contractions, leaking of fluid and fetal movement were reviewed in detail with the patient. Please refer to After Visit Summary for other counseling recommendations.   No follow-ups on file.  Future Appointments  Date Time Provider Department Center  05/14/2021  2:30 PM [facility] NURSE [facility]  05/14/2021  2:45 PM [facility] US4 [facility]    Sharen Counter, CNM

## 2021-05-14 ENCOUNTER — Ambulatory Visit: Payer: Medicaid Other | Admitting: *Deleted

## 2021-05-14 ENCOUNTER — Encounter: Payer: Self-pay | Admitting: *Deleted

## 2021-05-14 ENCOUNTER — Other Ambulatory Visit: Payer: Self-pay | Admitting: *Deleted

## 2021-05-14 ENCOUNTER — Ambulatory Visit: Payer: Medicaid Other | Attending: Obstetrics and Gynecology

## 2021-05-14 VITALS — BP 113/75 | HR 96

## 2021-05-14 DIAGNOSIS — O0990 Supervision of high risk pregnancy, unspecified, unspecified trimester: Secondary | ICD-10-CM | POA: Diagnosis present

## 2021-05-14 DIAGNOSIS — Z3A19 19 weeks gestation of pregnancy: Secondary | ICD-10-CM

## 2021-05-14 DIAGNOSIS — O2342 Unspecified infection of urinary tract in pregnancy, second trimester: Secondary | ICD-10-CM

## 2021-05-14 DIAGNOSIS — Z3689 Encounter for other specified antenatal screening: Secondary | ICD-10-CM | POA: Diagnosis not present

## 2021-05-14 DIAGNOSIS — O99212 Obesity complicating pregnancy, second trimester: Secondary | ICD-10-CM | POA: Diagnosis not present

## 2021-05-14 DIAGNOSIS — O09212 Supervision of pregnancy with history of pre-term labor, second trimester: Secondary | ICD-10-CM

## 2021-05-14 DIAGNOSIS — O09292 Supervision of pregnancy with other poor reproductive or obstetric history, second trimester: Secondary | ICD-10-CM | POA: Diagnosis not present

## 2021-05-14 DIAGNOSIS — Z6839 Body mass index (BMI) 39.0-39.9, adult: Secondary | ICD-10-CM

## 2021-06-10 ENCOUNTER — Ambulatory Visit (INDEPENDENT_AMBULATORY_CARE_PROVIDER_SITE_OTHER): Payer: Medicaid Other | Admitting: Obstetrics and Gynecology

## 2021-06-10 ENCOUNTER — Other Ambulatory Visit: Payer: Self-pay

## 2021-06-10 ENCOUNTER — Encounter: Payer: Self-pay | Admitting: Obstetrics and Gynecology

## 2021-06-10 VITALS — BP 121/82 | HR 109 | Wt 206.0 lb

## 2021-06-10 DIAGNOSIS — Z8632 Personal history of gestational diabetes: Secondary | ICD-10-CM

## 2021-06-10 DIAGNOSIS — Z8759 Personal history of other complications of pregnancy, childbirth and the puerperium: Secondary | ICD-10-CM

## 2021-06-10 DIAGNOSIS — R8271 Bacteriuria: Secondary | ICD-10-CM

## 2021-06-10 DIAGNOSIS — O09292 Supervision of pregnancy with other poor reproductive or obstetric history, second trimester: Secondary | ICD-10-CM

## 2021-06-10 DIAGNOSIS — Z98891 History of uterine scar from previous surgery: Secondary | ICD-10-CM

## 2021-06-10 DIAGNOSIS — O0990 Supervision of high risk pregnancy, unspecified, unspecified trimester: Secondary | ICD-10-CM

## 2021-06-10 DIAGNOSIS — R87610 Atypical squamous cells of undetermined significance on cytologic smear of cervix (ASC-US): Secondary | ICD-10-CM

## 2021-06-10 DIAGNOSIS — O09892 Supervision of other high risk pregnancies, second trimester: Secondary | ICD-10-CM

## 2021-06-10 DIAGNOSIS — R87613 High grade squamous intraepithelial lesion on cytologic smear of cervix (HGSIL): Secondary | ICD-10-CM

## 2021-06-10 DIAGNOSIS — R87810 Cervical high risk human papillomavirus (HPV) DNA test positive: Secondary | ICD-10-CM

## 2021-06-10 NOTE — Progress Notes (Signed)
Subjective:  Jennifer Cohen is a 30 y.o. T5T7322 at [redacted]w[redacted]d being seen today for ongoing prenatal care.  She is currently monitored for the following issues for this high-risk pregnancy and has History of VBAC; History of shoulder dystocia in prior pregnancy; Supervision of high risk pregnancy, antepartum; Hx of gestational diabetes in prior pregnancy, currently pregnant, second trimester; Hx of cesarean section; Hx of preterm delivery, currently pregnant, second trimester; GBS bacteriuria; and ASCUS with positive high risk HPV cervical on their problem list.  Patient reports no complaints.  Contractions: Not present. Vag. Bleeding: None.  Movement: Present. Denies leaking of fluid.   The following portions of the patient's history were reviewed and updated as appropriate: allergies, current medications, past family history, past medical history, past social history, past surgical history and problem list. Problem list updated.  Objective:   Vitals:   06/10/21 1407  BP: 121/82  Pulse: (!) 109  Weight: 206 lb (93.4 kg)    Fetal Status: Fetal Heart Rate (bpm): 159   Movement: Present     General:  Alert, oriented and cooperative. Patient is in no acute distress.  Skin: Skin is warm and dry. No rash noted.   Cardiovascular: Normal heart rate noted  Respiratory: Normal respiratory effort, no problems with respiration noted  Abdomen: Soft, gravid, appropriate for gestational age. Pain/Pressure: Present     Pelvic:  Cervical exam deferred        Extremities: Normal range of motion.  Edema: None  Mental Status: Normal mood and affect. Normal behavior. Normal judgment and thought content.   Urinalysis:      Assessment and Plan:  Pregnancy: G2R4270 at [redacted]w[redacted]d  1. Supervision of high risk pregnancy, antepartum Stable F/U growth scan 06/13/21  2. Hx of gestational diabetes in prior pregnancy, currently pregnant, second trimester Stable Normal early A1c Glucola next visit  3. Hx of  cesarean section Stable Successful VBAC x 3  4. Hx of preterm delivery, currently pregnant, second trimester Stable, without meds  5. GBS bacteriuria Tx while in labor  6. History of shoulder dystocia in prior pregnancy Growth scan this week   7. History of VBAC Successful x 3   8.ASCUS + HR HPV Stable Repeat pap smear PP     Preterm labor symptoms and general obstetric precautions including but not limited to vaginal bleeding, contractions, leaking of fluid and fetal movement were reviewed in detail with the patient. Please refer to After Visit Summary for other counseling recommendations.  Return in about 4 weeks (around 07/08/2021) for OB visit, face to face, MD only, fasting for Glucola.   Hermina Staggers, MD

## 2021-06-10 NOTE — Patient Instructions (Signed)

## 2021-06-10 NOTE — Progress Notes (Signed)
ROB, declined FLU vaccine.

## 2021-06-13 ENCOUNTER — Ambulatory Visit: Payer: Medicaid Other | Attending: Obstetrics

## 2021-06-13 ENCOUNTER — Other Ambulatory Visit: Payer: Self-pay

## 2021-06-13 ENCOUNTER — Other Ambulatory Visit: Payer: Self-pay | Admitting: Obstetrics

## 2021-06-13 ENCOUNTER — Ambulatory Visit: Payer: Medicaid Other | Admitting: *Deleted

## 2021-06-13 ENCOUNTER — Encounter: Payer: Self-pay | Admitting: *Deleted

## 2021-06-13 VITALS — BP 119/54 | HR 102

## 2021-06-13 DIAGNOSIS — Z3A23 23 weeks gestation of pregnancy: Secondary | ICD-10-CM | POA: Insufficient documentation

## 2021-06-13 DIAGNOSIS — O09292 Supervision of pregnancy with other poor reproductive or obstetric history, second trimester: Secondary | ICD-10-CM | POA: Insufficient documentation

## 2021-06-13 DIAGNOSIS — Z6839 Body mass index (BMI) 39.0-39.9, adult: Secondary | ICD-10-CM

## 2021-06-13 DIAGNOSIS — O0990 Supervision of high risk pregnancy, unspecified, unspecified trimester: Secondary | ICD-10-CM

## 2021-06-13 DIAGNOSIS — O34219 Maternal care for unspecified type scar from previous cesarean delivery: Secondary | ICD-10-CM | POA: Insufficient documentation

## 2021-06-13 DIAGNOSIS — E669 Obesity, unspecified: Secondary | ICD-10-CM

## 2021-06-13 DIAGNOSIS — O09212 Supervision of pregnancy with history of pre-term labor, second trimester: Secondary | ICD-10-CM | POA: Diagnosis not present

## 2021-06-13 DIAGNOSIS — O99212 Obesity complicating pregnancy, second trimester: Secondary | ICD-10-CM | POA: Diagnosis not present

## 2021-06-13 DIAGNOSIS — O0992 Supervision of high risk pregnancy, unspecified, second trimester: Secondary | ICD-10-CM | POA: Insufficient documentation

## 2021-06-14 ENCOUNTER — Telehealth: Payer: Self-pay

## 2021-06-14 NOTE — Telephone Encounter (Signed)
Called patient to make her aware of 06/28/21 ultrasound appointment - no answer

## 2021-06-17 ENCOUNTER — Other Ambulatory Visit: Payer: Self-pay | Admitting: *Deleted

## 2021-06-17 DIAGNOSIS — O365990 Maternal care for other known or suspected poor fetal growth, unspecified trimester, not applicable or unspecified: Secondary | ICD-10-CM

## 2021-06-28 ENCOUNTER — Ambulatory Visit: Payer: Medicaid Other

## 2021-07-01 ENCOUNTER — Other Ambulatory Visit: Payer: Self-pay

## 2021-07-01 ENCOUNTER — Ambulatory Visit: Payer: Medicaid Other | Attending: Obstetrics | Admitting: *Deleted

## 2021-07-01 ENCOUNTER — Ambulatory Visit: Payer: Medicaid Other

## 2021-07-04 ENCOUNTER — Encounter (HOSPITAL_COMMUNITY): Payer: Self-pay | Admitting: Obstetrics and Gynecology

## 2021-07-04 ENCOUNTER — Ambulatory Visit: Payer: Medicaid Other

## 2021-07-04 ENCOUNTER — Inpatient Hospital Stay (HOSPITAL_COMMUNITY)
Admission: AD | Admit: 2021-07-04 | Discharge: 2021-07-04 | Disposition: A | Discharge status: Home / Self Care | Payer: Medicaid Other | Attending: Obstetrics and Gynecology | Admitting: Obstetrics and Gynecology

## 2021-07-04 ENCOUNTER — Other Ambulatory Visit: Payer: Self-pay

## 2021-07-04 DIAGNOSIS — O26893 Other specified pregnancy related conditions, third trimester: Secondary | ICD-10-CM | POA: Insufficient documentation

## 2021-07-04 DIAGNOSIS — O9A313 Physical abuse complicating pregnancy, third trimester: Secondary | ICD-10-CM | POA: Diagnosis not present

## 2021-07-04 DIAGNOSIS — O99891 Other specified diseases and conditions complicating pregnancy: Secondary | ICD-10-CM

## 2021-07-04 DIAGNOSIS — F17210 Nicotine dependence, cigarettes, uncomplicated: Secondary | ICD-10-CM | POA: Insufficient documentation

## 2021-07-04 DIAGNOSIS — Z3A26 26 weeks gestation of pregnancy: Secondary | ICD-10-CM | POA: Diagnosis not present

## 2021-07-04 DIAGNOSIS — O368120 Decreased fetal movements, second trimester, not applicable or unspecified: Secondary | ICD-10-CM | POA: Insufficient documentation

## 2021-07-04 DIAGNOSIS — O99333 Smoking (tobacco) complicating pregnancy, third trimester: Secondary | ICD-10-CM | POA: Diagnosis not present

## 2021-07-04 DIAGNOSIS — Z113 Encounter for screening for infections with a predominantly sexual mode of transmission: Secondary | ICD-10-CM | POA: Diagnosis not present

## 2021-07-04 DIAGNOSIS — R030 Elevated blood-pressure reading, without diagnosis of hypertension: Secondary | ICD-10-CM | POA: Diagnosis not present

## 2021-07-04 DIAGNOSIS — Z3689 Encounter for other specified antenatal screening: Secondary | ICD-10-CM

## 2021-07-04 LAB — HEPATITIS C ANTIBODY: HCV Ab: NONREACTIVE

## 2021-07-04 LAB — CBC
HCT: 32.3 % — ABNORMAL LOW (ref 36.0–46.0)
Hemoglobin: 10.8 g/dL — ABNORMAL LOW (ref 12.0–15.0)
MCH: 26.2 pg (ref 26.0–34.0)
MCHC: 33.4 g/dL (ref 30.0–36.0)
MCV: 78.2 fL — ABNORMAL LOW (ref 80.0–100.0)
Platelets: 359 10*3/uL (ref 150–400)
RBC: 4.13 MIL/uL (ref 3.87–5.11)
RDW: 14 % (ref 11.5–15.5)
WBC: 16.5 10*3/uL — ABNORMAL HIGH (ref 4.0–10.5)
nRBC: 0 % (ref 0.0–0.2)

## 2021-07-04 LAB — COMPREHENSIVE METABOLIC PANEL
ALT: 12 U/L (ref 0–44)
AST: 14 U/L — ABNORMAL LOW (ref 15–41)
Albumin: 2.6 g/dL — ABNORMAL LOW (ref 3.5–5.0)
Alkaline Phosphatase: 52 U/L (ref 38–126)
Anion gap: 7 (ref 5–15)
BUN: 7 mg/dL (ref 6–20)
CO2: 22 mmol/L (ref 22–32)
Calcium: 8.7 mg/dL — ABNORMAL LOW (ref 8.9–10.3)
Chloride: 105 mmol/L (ref 98–111)
Creatinine, Ser: 0.41 mg/dL — ABNORMAL LOW (ref 0.44–1.00)
GFR, Estimated: >60 mL/min (ref >60)
Glucose, Bld: 101 mg/dL — ABNORMAL HIGH (ref 70–99)
Potassium: 3.2 mmol/L — ABNORMAL LOW (ref 3.5–5.1)
Sodium: 134 mmol/L — ABNORMAL LOW (ref 135–145)
Total Bilirubin: 0.3 mg/dL (ref 0.3–1.2)
Total Protein: 6.1 g/dL — ABNORMAL LOW (ref 6.5–8.1)

## 2021-07-04 LAB — WET PREP, GENITAL
Clue Cells Wet Prep HPF POC: NONE SEEN
Sperm: NONE SEEN
Trich, Wet Prep: NONE SEEN
WBC, Wet Prep HPF POC: NONE SEEN
Yeast Wet Prep HPF POC: NONE SEEN

## 2021-07-04 LAB — HIV ANTIBODY (ROUTINE TESTING W REFLEX): HIV Screen 4th Generation wRfx: NONREACTIVE

## 2021-07-04 LAB — PROTEIN / CREATININE RATIO, URINE
Creatinine, Urine: 99.22 mg/dL
Protein Creatinine Ratio: 0.18 mg/mg{Cre} — ABNORMAL HIGH (ref 0.00–0.15)
Total Protein, Urine: 18 mg/dL

## 2021-07-04 LAB — HEPATITIS B SURFACE ANTIGEN: Hepatitis B Surface Ag: NONREACTIVE

## 2021-07-04 LAB — RPR: RPR Ser Ql: NONREACTIVE
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
## 2021-07-05 ENCOUNTER — Ambulatory Visit: Payer: Medicaid Other | Attending: Obstetrics

## 2021-07-05 ENCOUNTER — Ambulatory Visit: Payer: Medicaid Other | Admitting: *Deleted

## 2021-07-05 ENCOUNTER — Encounter: Payer: Self-pay | Admitting: *Deleted

## 2021-07-05 VITALS — BP 127/81 | HR 113

## 2021-07-05 DIAGNOSIS — E669 Obesity, unspecified: Secondary | ICD-10-CM

## 2021-07-05 DIAGNOSIS — O0990 Supervision of high risk pregnancy, unspecified, unspecified trimester: Secondary | ICD-10-CM | POA: Insufficient documentation

## 2021-07-05 DIAGNOSIS — O365990 Maternal care for other known or suspected poor fetal growth, unspecified trimester, not applicable or unspecified: Secondary | ICD-10-CM | POA: Insufficient documentation

## 2021-07-05 DIAGNOSIS — O365920 Maternal care for other known or suspected poor fetal growth, second trimester, not applicable or unspecified: Secondary | ICD-10-CM

## 2021-07-05 DIAGNOSIS — O99212 Obesity complicating pregnancy, second trimester: Secondary | ICD-10-CM | POA: Diagnosis not present

## 2021-07-05 DIAGNOSIS — O34219 Maternal care for unspecified type scar from previous cesarean delivery: Secondary | ICD-10-CM

## 2021-07-05 DIAGNOSIS — Z3A26 26 weeks gestation of pregnancy: Secondary | ICD-10-CM

## 2021-07-05 LAB — GC/CHLAMYDIA PROBE AMP (~~LOC~~) NOT AT ARMC
Chlamydia: NEGATIVE
Comment: NEGATIVE
Comment: NORMAL
Neisseria Gonorrhea: NEGATIVE

## 2021-07-08 ENCOUNTER — Encounter: Payer: Self-pay | Admitting: Advanced Practice Midwife

## 2021-07-08 ENCOUNTER — Other Ambulatory Visit: Payer: Self-pay

## 2021-07-08 ENCOUNTER — Ambulatory Visit (INDEPENDENT_AMBULATORY_CARE_PROVIDER_SITE_OTHER): Payer: Medicaid Other | Admitting: Licensed Clinical Social Worker

## 2021-07-08 ENCOUNTER — Other Ambulatory Visit: Payer: Medicaid Other

## 2021-07-08 ENCOUNTER — Ambulatory Visit (INDEPENDENT_AMBULATORY_CARE_PROVIDER_SITE_OTHER): Payer: Medicaid Other | Admitting: Advanced Practice Midwife

## 2021-07-08 ENCOUNTER — Other Ambulatory Visit: Payer: Self-pay | Admitting: *Deleted

## 2021-07-08 VITALS — BP 134/82 | HR 102 | Wt 209.3 lb

## 2021-07-08 DIAGNOSIS — R4589 Other symptoms and signs involving emotional state: Secondary | ICD-10-CM

## 2021-07-08 DIAGNOSIS — T7491XD Unspecified adult maltreatment, confirmed, subsequent encounter: Secondary | ICD-10-CM | POA: Diagnosis not present

## 2021-07-08 DIAGNOSIS — O0990 Supervision of high risk pregnancy, unspecified, unspecified trimester: Secondary | ICD-10-CM

## 2021-07-08 DIAGNOSIS — F32A Depression, unspecified: Secondary | ICD-10-CM

## 2021-07-08 DIAGNOSIS — O09899 Supervision of other high risk pregnancies, unspecified trimester: Secondary | ICD-10-CM

## 2021-07-08 DIAGNOSIS — O365921 Maternal care for other known or suspected poor fetal growth, second trimester, fetus 1: Secondary | ICD-10-CM

## 2021-07-08 DIAGNOSIS — Z87898 Personal history of other specified conditions: Secondary | ICD-10-CM | POA: Diagnosis not present

## 2021-07-08 DIAGNOSIS — O99343 Other mental disorders complicating pregnancy, third trimester: Secondary | ICD-10-CM

## 2021-07-08 MED ORDER — SERTRALINE HCL 25 MG PO TABS: 25.0000 mg | ORAL_TABLET | Freq: Every day | ORAL | 0 refills | Status: DC

## 2021-07-08 MED ORDER — SERTRALINE HCL 50 MG PO TABS
50.0000 mg | ORAL_TABLET | Freq: Every day | ORAL | 3 refills | Status: DC
Start: 2021-07-16 — End: 2021-08-07
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
## 2021-07-09 LAB — GLUCOSE TOLERANCE, 2 HOURS W/ 1HR
Glucose, 1 hour: 121 mg/dL (ref 70–179)
Glucose, 2 hour: 109 mg/dL (ref 70–152)
Glucose, Fasting: 67 mg/dL — ABNORMAL LOW (ref 70–91)
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
## 2021-07-22 ENCOUNTER — Ambulatory Visit: Payer: Medicaid Other | Admitting: *Deleted

## 2021-07-22 ENCOUNTER — Ambulatory Visit: Payer: Medicaid Other | Attending: Obstetrics

## 2021-07-22 ENCOUNTER — Ambulatory Visit: Payer: Medicaid Other | Admitting: Licensed Clinical Social Worker

## 2021-07-22 ENCOUNTER — Encounter: Payer: Medicaid Other | Admitting: Advanced Practice Midwife

## 2021-07-22 ENCOUNTER — Other Ambulatory Visit: Payer: Self-pay

## 2021-07-22 ENCOUNTER — Ambulatory Visit (HOSPITAL_BASED_OUTPATIENT_CLINIC_OR_DEPARTMENT_OTHER): Payer: Medicaid Other | Admitting: *Deleted

## 2021-07-22 VITALS — BP 128/91 | HR 107

## 2021-07-22 DIAGNOSIS — O365920 Maternal care for other known or suspected poor fetal growth, second trimester, not applicable or unspecified: Secondary | ICD-10-CM

## 2021-07-22 DIAGNOSIS — O09213 Supervision of pregnancy with history of pre-term labor, third trimester: Secondary | ICD-10-CM

## 2021-07-22 DIAGNOSIS — Z3A29 29 weeks gestation of pregnancy: Secondary | ICD-10-CM | POA: Diagnosis not present

## 2021-07-22 DIAGNOSIS — O365921 Maternal care for other known or suspected poor fetal growth, second trimester, fetus 1: Secondary | ICD-10-CM | POA: Diagnosis present

## 2021-07-22 DIAGNOSIS — O365930 Maternal care for other known or suspected poor fetal growth, third trimester, not applicable or unspecified: Secondary | ICD-10-CM | POA: Diagnosis not present

## 2021-07-22 DIAGNOSIS — O0990 Supervision of high risk pregnancy, unspecified, unspecified trimester: Secondary | ICD-10-CM | POA: Diagnosis present

## 2021-07-22 DIAGNOSIS — O09899 Supervision of other high risk pregnancies, unspecified trimester: Secondary | ICD-10-CM | POA: Insufficient documentation

## 2021-07-22 DIAGNOSIS — O09293 Supervision of pregnancy with other poor reproductive or obstetric history, third trimester: Secondary | ICD-10-CM

## 2021-07-22 DIAGNOSIS — O09893 Supervision of other high risk pregnancies, third trimester: Secondary | ICD-10-CM
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
## 2021-07-22 NOTE — Procedures (Signed)
Jennifer Cohen [birth year] [redacted]w[redacted]d  Fetus A Non-Stress Test Interpretation for 07/22/21  Indication: IUGR  Fetal Heart Rate A Mode: External Baseline Rate (A): 145 bpm Variability: Moderate Accelerations: 15 x 15 Decelerations: Variable Multiple birth?: No  Uterine Activity Mode: Palpation, Toco Contraction Frequency (min): None Resting Tone Palpated: Relaxed Resting Time: Adequate  Interpretation (Fetal Testing) Nonstress Test Interpretation: Reactive Comments: Dr. Parke Poisson reviewed tracing.

## 2021-07-24 ENCOUNTER — Inpatient Hospital Stay (HOSPITAL_COMMUNITY)
Admission: AD | Admit: 2021-07-24 | Discharge: 2021-07-25 | Disposition: A | Discharge status: Home / Self Care | Payer: Medicaid Other | Attending: Obstetrics & Gynecology | Admitting: Obstetrics & Gynecology

## 2021-07-24 ENCOUNTER — Other Ambulatory Visit: Payer: Self-pay

## 2021-07-24 DIAGNOSIS — Z3689 Encounter for other specified antenatal screening: Secondary | ICD-10-CM | POA: Insufficient documentation

## 2021-07-24 DIAGNOSIS — Z3A29 29 weeks gestation of pregnancy: Secondary | ICD-10-CM | POA: Insufficient documentation

## 2021-07-24 DIAGNOSIS — O9A213 Injury, poisoning and certain other consequences of external causes complicating pregnancy, third trimester: Secondary | ICD-10-CM | POA: Insufficient documentation

## 2021-07-24 DIAGNOSIS — O0990 Supervision of high risk pregnancy, unspecified, unspecified trimester: Secondary | ICD-10-CM

## 2021-07-24 DIAGNOSIS — S3991XA Unspecified injury of abdomen, initial encounter: Secondary | ICD-10-CM | POA: Diagnosis not present

## 2021-07-24 DIAGNOSIS — O1403 Mild to moderate pre-eclampsia, third trimester: Secondary | ICD-10-CM | POA: Diagnosis not present

## 2021-07-24 DIAGNOSIS — R109 Unspecified abdominal pain: Secondary | ICD-10-CM | POA: Diagnosis not present
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
## 2021-07-25 ENCOUNTER — Encounter (HOSPITAL_COMMUNITY): Payer: Self-pay | Admitting: Obstetrics & Gynecology

## 2021-07-25 DIAGNOSIS — Z3689 Encounter for other specified antenatal screening: Secondary | ICD-10-CM

## 2021-07-25 DIAGNOSIS — O1403 Mild to moderate pre-eclampsia, third trimester: Secondary | ICD-10-CM | POA: Diagnosis present

## 2021-07-25 LAB — CBC
HCT: 34 % — ABNORMAL LOW (ref 36.0–46.0)
Hemoglobin: 11.8 g/dL — ABNORMAL LOW (ref 12.0–15.0)
MCH: 26.8 pg (ref 26.0–34.0)
MCHC: 34.7 g/dL (ref 30.0–36.0)
MCV: 77.1 fL — ABNORMAL LOW (ref 80.0–100.0)
Platelets: 367 10*3/uL (ref 150–400)
RBC: 4.41 MIL/uL (ref 3.87–5.11)
RDW: 13.9 % (ref 11.5–15.5)
WBC: 15.1 10*3/uL — ABNORMAL HIGH (ref 4.0–10.5)
nRBC: 0 % (ref 0.0–0.2)

## 2021-07-25 LAB — COMPREHENSIVE METABOLIC PANEL
ALT: 13 U/L (ref 0–44)
AST: 14 U/L — ABNORMAL LOW (ref 15–41)
Albumin: 2.7 g/dL — ABNORMAL LOW (ref 3.5–5.0)
Alkaline Phosphatase: 57 U/L (ref 38–126)
Anion gap: 10 (ref 5–15)
BUN: 5 mg/dL — ABNORMAL LOW (ref 6–20)
CO2: 21 mmol/L — ABNORMAL LOW (ref 22–32)
Calcium: 8.7 mg/dL — ABNORMAL LOW (ref 8.9–10.3)
Chloride: 104 mmol/L (ref 98–111)
Creatinine, Ser: 0.42 mg/dL — ABNORMAL LOW (ref 0.44–1.00)
GFR, Estimated: >60 mL/min (ref >60)
Glucose, Bld: 86 mg/dL (ref 70–99)
Potassium: 3.4 mmol/L — ABNORMAL LOW (ref 3.5–5.1)
Sodium: 135 mmol/L (ref 135–145)
Total Bilirubin: 0.6 mg/dL (ref 0.3–1.2)
Total Protein: 6.3 g/dL — ABNORMAL LOW (ref 6.5–8.1)

## 2021-07-25 LAB — URINALYSIS, ROUTINE W REFLEX MICROSCOPIC
Bilirubin Urine: NEGATIVE
Glucose, UA: NEGATIVE mg/dL
Ketones, ur: 5 mg/dL — AB
Leukocytes,Ua: NEGATIVE
Nitrite: NEGATIVE
Protein, ur: 100 mg/dL — AB
Specific Gravity, Urine: 1.021 (ref 1.005–1.030)
pH: 6 (ref 5.0–8.0)

## 2021-07-25 LAB — PROTEIN / CREATININE RATIO, URINE
Creatinine, Urine: 193.12 mg/dL
Protein Creatinine Ratio: 0.45 mg/mg{Cre} — ABNORMAL HIGH (ref 0.00–0.15)
Total Protein, Urine: 87 mg/dL

## 2021-07-25 MED ORDER — ACETAMINOPHEN 500 MG PO TABS: 1000.0000 mg | ORAL_TABLET | Freq: Once | ORAL | Status: DC
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
## 2021-07-27 ENCOUNTER — Other Ambulatory Visit: Payer: Self-pay

## 2021-07-27 ENCOUNTER — Inpatient Hospital Stay (HOSPITAL_COMMUNITY)
Admission: AD | Admit: 2021-07-27 | Discharge: 2021-07-27 | Disposition: A | Discharge status: Home / Self Care | Payer: Medicaid Other | Attending: Obstetrics & Gynecology | Admitting: Obstetrics & Gynecology

## 2021-07-27 ENCOUNTER — Encounter (HOSPITAL_COMMUNITY): Payer: Self-pay | Admitting: Obstetrics & Gynecology

## 2021-07-27 ENCOUNTER — Inpatient Hospital Stay (HOSPITAL_BASED_OUTPATIENT_CLINIC_OR_DEPARTMENT_OTHER): Payer: Medicaid Other

## 2021-07-27 DIAGNOSIS — Z8751 Personal history of pre-term labor: Secondary | ICD-10-CM | POA: Insufficient documentation

## 2021-07-27 DIAGNOSIS — Z3A29 29 weeks gestation of pregnancy: Secondary | ICD-10-CM | POA: Diagnosis not present

## 2021-07-27 DIAGNOSIS — O34219 Maternal care for unspecified type scar from previous cesarean delivery: Secondary | ICD-10-CM

## 2021-07-27 DIAGNOSIS — M545 Low back pain, unspecified: Secondary | ICD-10-CM | POA: Insufficient documentation

## 2021-07-27 DIAGNOSIS — O26893 Other specified pregnancy related conditions, third trimester: Secondary | ICD-10-CM | POA: Diagnosis not present

## 2021-07-27 DIAGNOSIS — O9A213 Injury, poisoning and certain other consequences of external causes complicating pregnancy, third trimester: Secondary | ICD-10-CM | POA: Diagnosis not present

## 2021-07-27 DIAGNOSIS — N939 Abnormal uterine and vaginal bleeding, unspecified: Secondary | ICD-10-CM

## 2021-07-27 DIAGNOSIS — R109 Unspecified abdominal pain: Secondary | ICD-10-CM | POA: Insufficient documentation

## 2021-07-27 DIAGNOSIS — O4693 Antepartum hemorrhage, unspecified, third trimester: Secondary | ICD-10-CM

## 2021-07-27 DIAGNOSIS — E669 Obesity, unspecified: Secondary | ICD-10-CM

## 2021-07-27 DIAGNOSIS — O99213 Obesity complicating pregnancy, third trimester: Secondary | ICD-10-CM

## 2021-07-27 LAB — URINALYSIS, ROUTINE W REFLEX MICROSCOPIC
Bilirubin Urine: NEGATIVE
Glucose, UA: NEGATIVE mg/dL
Ketones, ur: NEGATIVE mg/dL
Leukocytes,Ua: NEGATIVE
Nitrite: NEGATIVE
Protein, ur: NEGATIVE mg/dL
Specific Gravity, Urine: 1.013 (ref 1.005–1.030)
pH: 6 (ref 5.0–8.0)

## 2021-07-27 LAB — CBC
HCT: 34.4 % — ABNORMAL LOW (ref 36.0–46.0)
Hemoglobin: 11.3 g/dL — ABNORMAL LOW (ref 12.0–15.0)
MCH: 26.6 pg (ref 26.0–34.0)
MCHC: 32.8 g/dL (ref 30.0–36.0)
MCV: 80.9 fL (ref 80.0–100.0)
Platelets: 361 10*3/uL (ref 150–400)
RBC: 4.25 MIL/uL (ref 3.87–5.11)
RDW: 14.5 % (ref 11.5–15.5)
WBC: 13.2 10*3/uL — ABNORMAL HIGH (ref 4.0–10.5)
nRBC: 0 % (ref 0.0–0.2)

## 2021-07-27 LAB — WET PREP, GENITAL
Clue Cells Wet Prep HPF POC: NONE SEEN
Sperm: NONE SEEN
Trich, Wet Prep: NONE SEEN
WBC, Wet Prep HPF POC: <10 (ref <10)
Yeast Wet Prep HPF POC: NONE SEEN

## 2021-07-27 MED ORDER — CYCLOBENZAPRINE HCL 10 MG PO TABS: 10.0000 mg | ORAL_TABLET | Freq: Two times a day (BID) | ORAL | 0 refills | Status: DC | PRN

## 2021-07-27 MED ORDER — LACTATED RINGERS IV BOLUS
1000.0000 mL | Freq: Once | INTRAVENOUS | Status: AC
  Administered 2021-07-27: 1000 mL via INTRAVENOUS

## 2021-07-27 MED ORDER — NIFEDIPINE 10 MG PO CAPS
10.0000 mg | ORAL_CAPSULE | ORAL | Status: DC | PRN
  Administered 2021-07-27: 10 mg via ORAL
  Filled 2021-07-27: qty 1

## 2021-07-27 MED ORDER — ACETAMINOPHEN 325 MG PO TABS: 650.0000 mg | ORAL_TABLET | Freq: Four times a day (QID) | ORAL | 0 refills | Status: DC | PRN

## 2021-07-27 MED ORDER — CYCLOBENZAPRINE HCL 5 MG PO TABS
10.0000 mg | ORAL_TABLET | Freq: Once | ORAL | Status: AC
  Administered 2021-07-27: 10 mg via ORAL
  Filled 2021-07-27: qty 2

## 2021-07-27 MED ORDER — ACETAMINOPHEN 500 MG PO TABS
1000.0000 mg | ORAL_TABLET | Freq: Once | ORAL | Status: AC
  Administered 2021-07-27: 1000 mg via ORAL
  Filled 2021-07-27: qty 2
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
## 2021-07-27 NOTE — MAU Note (Signed)
Jennifer Cohen is a 30 y.o. at [redacted]w[redacted]d here in MAU reporting: noticed some dark red bleeding this morning when she used the bathroom. States it was on the toilet paper when she wiped. Also having lower abdominal pain since she got here. The pain is intermittent. Denies LOF. +FM  Onset of complaint: today  Pain score: 7/10  Vitals:   07/27/21 1114  BP: 131/70  Pulse: 95  Resp: 16  Temp: 99 F (37.2 C)  SpO2: 99%     FHT: EFM applied in room, +FM  Lab orders placed from triage: UA

## 2021-07-29 ENCOUNTER — Ambulatory Visit (HOSPITAL_BASED_OUTPATIENT_CLINIC_OR_DEPARTMENT_OTHER): Payer: Medicaid Other | Admitting: *Deleted

## 2021-07-29 ENCOUNTER — Encounter: Payer: Self-pay | Admitting: *Deleted

## 2021-07-29 ENCOUNTER — Other Ambulatory Visit: Payer: Self-pay

## 2021-07-29 ENCOUNTER — Ambulatory Visit: Payer: Medicaid Other | Admitting: *Deleted

## 2021-07-29 ENCOUNTER — Ambulatory Visit: Payer: Medicaid Other | Attending: Obstetrics

## 2021-07-29 VITALS — BP 119/64 | HR 90

## 2021-07-29 DIAGNOSIS — O0990 Supervision of high risk pregnancy, unspecified, unspecified trimester: Secondary | ICD-10-CM | POA: Diagnosis present

## 2021-07-29 DIAGNOSIS — O365930 Maternal care for other known or suspected poor fetal growth, third trimester, not applicable or unspecified: Secondary | ICD-10-CM | POA: Diagnosis present

## 2021-07-29 DIAGNOSIS — E669 Obesity, unspecified: Secondary | ICD-10-CM | POA: Diagnosis not present

## 2021-07-29 DIAGNOSIS — Z3A30 30 weeks gestation of pregnancy: Secondary | ICD-10-CM

## 2021-07-29 DIAGNOSIS — O365923 Maternal care for other known or suspected poor fetal growth, second trimester, fetus 3: Secondary | ICD-10-CM | POA: Diagnosis not present

## 2021-07-29 DIAGNOSIS — O365921 Maternal care for other known or suspected poor fetal growth, second trimester, fetus 1: Secondary | ICD-10-CM | POA: Insufficient documentation

## 2021-07-29 DIAGNOSIS — O99213 Obesity complicating pregnancy, third trimester: Secondary | ICD-10-CM

## 2021-07-29 DIAGNOSIS — O09899 Supervision of other high risk pregnancies, unspecified trimester: Secondary | ICD-10-CM | POA: Insufficient documentation

## 2021-07-29 LAB — GC/CHLAMYDIA PROBE AMP (~~LOC~~) NOT AT ARMC
Chlamydia: NEGATIVE
Comment: NEGATIVE
Comment: NORMAL
Neisseria Gonorrhea: NEGATIVE

## 2021-07-29 NOTE — Procedures (Signed)
Jennifer Cohen 08/12/1991 [redacted]w[redacted]d  Fetus A Non-Stress Test Interpretation for 07/29/21  Indication: IUGR  Fetal Heart Rate A Mode: External Baseline Rate (A): 145 bpm Variability: Moderate Accelerations: 10 x 10 Decelerations: None Multiple birth?: No  Uterine Activity Mode: Toco Contraction Frequency (min): none Resting Tone Palpated: Relaxed  Interpretation (Fetal Testing) Nonstress Test Interpretation: Reactive Overall Impression: Reassuring for gestational age Comments: tracing reviewed by Dr. Parke Poisson

## 2021-07-29 NOTE — Progress Notes (Signed)
5

## 2021-07-30 ENCOUNTER — Encounter: Payer: Medicaid Other | Admitting: Obstetrics

## 2021-08-05 ENCOUNTER — Ambulatory Visit: Payer: Medicaid Other

## 2021-08-07 ENCOUNTER — Other Ambulatory Visit: Payer: Self-pay

## 2021-08-07 ENCOUNTER — Ambulatory Visit: Payer: Medicaid Other | Attending: Obstetrics

## 2021-08-07 ENCOUNTER — Encounter: Payer: Medicaid Other | Admitting: Family Medicine

## 2021-08-07 ENCOUNTER — Ambulatory Visit: Payer: Medicaid Other | Admitting: *Deleted

## 2021-08-07 ENCOUNTER — Ambulatory Visit (HOSPITAL_BASED_OUTPATIENT_CLINIC_OR_DEPARTMENT_OTHER): Payer: Medicaid Other | Admitting: *Deleted

## 2021-08-07 VITALS — BP 114/49 | HR 97

## 2021-08-07 DIAGNOSIS — Z3A31 31 weeks gestation of pregnancy: Secondary | ICD-10-CM | POA: Diagnosis not present

## 2021-08-07 DIAGNOSIS — O09893 Supervision of other high risk pregnancies, third trimester: Secondary | ICD-10-CM

## 2021-08-07 DIAGNOSIS — O365930 Maternal care for other known or suspected poor fetal growth, third trimester, not applicable or unspecified: Secondary | ICD-10-CM | POA: Diagnosis not present

## 2021-08-07 DIAGNOSIS — O0990 Supervision of high risk pregnancy, unspecified, unspecified trimester: Secondary | ICD-10-CM | POA: Insufficient documentation

## 2021-08-07 DIAGNOSIS — O365921 Maternal care for other known or suspected poor fetal growth, second trimester, fetus 1: Secondary | ICD-10-CM | POA: Diagnosis not present

## 2021-08-07 DIAGNOSIS — O09899 Supervision of other high risk pregnancies, unspecified trimester: Secondary | ICD-10-CM | POA: Insufficient documentation

## 2021-08-07 DIAGNOSIS — O99213 Obesity complicating pregnancy, third trimester: Secondary | ICD-10-CM

## 2021-08-07 DIAGNOSIS — E669 Obesity, unspecified: Secondary | ICD-10-CM | POA: Diagnosis not present

## 2021-08-07 NOTE — Procedures (Signed)
Jennifer Cohen 01/16/1991 [redacted]w[redacted]d  Fetus A Non-Stress Test Interpretation for 08/07/21  Indication: IUGR  Fetal Heart Rate A Mode: External Baseline Rate (A): 140 bpm Variability: Moderate Accelerations: 15 x 15 Decelerations: Variable Multiple birth?: No  Uterine Activity Mode: Palpation, Toco Contraction Frequency (min): none Resting Tone Palpated: Relaxed  Interpretation (Fetal Testing) Nonstress Test Interpretation: Reactive Overall Impression: Reassuring for gestational age Comments: Dr. Parke Poisson reviewed tracing

## 2021-08-08 ENCOUNTER — Other Ambulatory Visit: Payer: Self-pay | Admitting: *Deleted

## 2021-08-08 DIAGNOSIS — O365930 Maternal care for other known or suspected poor fetal growth, third trimester, not applicable or unspecified: Secondary | ICD-10-CM

## 2021-08-13 ENCOUNTER — Other Ambulatory Visit: Payer: Medicaid Other

## 2021-08-13 ENCOUNTER — Ambulatory Visit: Payer: Medicaid Other | Attending: Obstetrics

## 2021-08-14 ENCOUNTER — Encounter: Payer: Medicaid Other | Admitting: Obstetrics

## 2021-08-19 ENCOUNTER — Ambulatory Visit: Payer: Medicaid Other | Attending: Obstetrics

## 2021-08-19 ENCOUNTER — Ambulatory Visit: Payer: Medicaid Other

## 2021-08-28 ENCOUNTER — Other Ambulatory Visit: Payer: Medicaid Other

## 2021-08-28 ENCOUNTER — Ambulatory Visit: Payer: Medicaid Other

## 2021-08-30 ENCOUNTER — Ambulatory Visit: Payer: Medicaid Other

## 2021-08-30 ENCOUNTER — Ambulatory Visit: Payer: Medicaid Other | Attending: Obstetrics

## 2021-09-01 NOTE — L&D Delivery Note (Signed)
Delivery Note At 4:55 PM a viable and healthy female was delivered via Vaginal, Spontaneous (Presentation: Left Occiput Anterior).  APGAR: 8, 9; weight  pending.   Placenta status: Spontaneous, Intact.  Cord: 3 vessels with the following complications: None.    Anesthesia: None Episiotomy: None Lacerations: None Suture Repair:  n/a Est. Blood Loss (mL): 100  Mom to postpartum.  Baby to Couplet care / Skin to Skin.  Jennifer Cohen is a 31 y.o. female [phone] with IUP at [redacted]w[redacted]d admitted for IOL for PEC without severe features.  She progressed with augmentation with IP foley balloon, Pitocin, and AROM to 8 cm then pushed involuntarily for ~ 15 minutes until she became complete and delivered in one contraction.  Anterior shoulder delivered slowly but without dystocia and pt repositioned to end of bed and shoulder and rest of body delivered without difficulty after repositioning.  Cord clamping delayed by 1-3 minutes then clamped by CNM and cut by FOB.  Placenta intact and spontaneous, bleeding minimal.  Intact perineum without repair.  Mom and baby stable prior to transfer to postpartum. She plans on breastfeeding. She requests interval BTL for birth control.   Sharen Counter 09/15/2021, 5:22 PM

## 2021-09-02 ENCOUNTER — Inpatient Hospital Stay (HOSPITAL_COMMUNITY)
Admission: AD | Admit: 2021-09-02 | Discharge: 2021-09-02 | Discharge status: Against Medical Advice | Payer: Medicaid Other | Attending: Obstetrics & Gynecology | Admitting: Obstetrics & Gynecology

## 2021-09-02 ENCOUNTER — Other Ambulatory Visit: Payer: Self-pay

## 2021-09-02 DIAGNOSIS — R109 Unspecified abdominal pain: Secondary | ICD-10-CM | POA: Diagnosis present

## 2021-09-02 DIAGNOSIS — Z5321 Procedure and treatment not carried out due to patient leaving prior to being seen by health care provider: Secondary | ICD-10-CM | POA: Diagnosis not present

## 2021-09-02 LAB — URINALYSIS, ROUTINE W REFLEX MICROSCOPIC
Bilirubin Urine: NEGATIVE
Glucose, UA: NEGATIVE mg/dL
Hgb urine dipstick: NEGATIVE
Ketones, ur: 5 mg/dL — AB
Leukocytes,Ua: NEGATIVE
Nitrite: NEGATIVE
Protein, ur: NEGATIVE mg/dL
Specific Gravity, Urine: 1.017 (ref 1.005–1.030)
pH: 6 (ref 5.0–8.0)

## 2021-09-02 NOTE — MAU Note (Signed)
Pt called for rooming, not in lobby x2

## 2021-09-02 NOTE — MAU Note (Signed)
Jennifer Cohen is a 31 y.o. at [redacted]w[redacted]d here in MAU reporting: back and abdominal pain since yesterday. Pain is constant. Had spotting yesterday but none today. No LOF. +FM  Pt has child with her. States no one can come get him due to them being at work.   Onset of complaint: yesterday  Pain score: 8/10  Vitals:   09/02/21 1552  BP: 136/73  Pulse: 100  Resp: 16  Temp: 98 F (36.7 C)  SpO2: 96%     FHT:152  Lab orders placed from triage: UA

## 2021-09-02 NOTE — MAU Note (Signed)
Pt called for rooming, not in lobby x3

## 2021-09-02 NOTE — MAU Note (Signed)
Pt called for rooming, not in lobby x1

## 2021-09-04 ENCOUNTER — Ambulatory Visit: Payer: Medicaid Other

## 2021-09-04 ENCOUNTER — Other Ambulatory Visit: Payer: Medicaid Other

## 2021-09-04 ENCOUNTER — Encounter: Payer: Medicaid Other | Admitting: Women's Health

## 2021-09-09 ENCOUNTER — Ambulatory Visit: Payer: Medicaid Other

## 2021-09-14 ENCOUNTER — Inpatient Hospital Stay (HOSPITAL_COMMUNITY): Payer: Medicaid Other

## 2021-09-14 ENCOUNTER — Other Ambulatory Visit: Payer: Self-pay

## 2021-09-14 ENCOUNTER — Inpatient Hospital Stay (HOSPITAL_COMMUNITY)
Admission: AD | Admit: 2021-09-14 | Discharge: 2021-09-17 | DRG: 807 | Disposition: A | Discharge status: Home / Self Care | Payer: Medicaid Other | Attending: Obstetrics & Gynecology | Admitting: Obstetrics & Gynecology

## 2021-09-14 ENCOUNTER — Encounter (HOSPITAL_COMMUNITY): Payer: Self-pay | Admitting: Obstetrics and Gynecology

## 2021-09-14 DIAGNOSIS — Z3A37 37 weeks gestation of pregnancy: Secondary | ICD-10-CM

## 2021-09-14 DIAGNOSIS — O1404 Mild to moderate pre-eclampsia, complicating childbirth: Secondary | ICD-10-CM | POA: Diagnosis present

## 2021-09-14 DIAGNOSIS — Z3A36 36 weeks gestation of pregnancy: Secondary | ICD-10-CM | POA: Diagnosis not present

## 2021-09-14 DIAGNOSIS — Z98891 History of uterine scar from previous surgery: Secondary | ICD-10-CM

## 2021-09-14 DIAGNOSIS — O99334 Smoking (tobacco) complicating childbirth: Secondary | ICD-10-CM | POA: Diagnosis present

## 2021-09-14 DIAGNOSIS — O1403 Mild to moderate pre-eclampsia, third trimester: Secondary | ICD-10-CM

## 2021-09-14 DIAGNOSIS — Z20822 Contact with and (suspected) exposure to covid-19: Secondary | ICD-10-CM | POA: Diagnosis present

## 2021-09-14 DIAGNOSIS — O99824 Streptococcus B carrier state complicating childbirth: Secondary | ICD-10-CM | POA: Diagnosis present

## 2021-09-14 DIAGNOSIS — O34219 Maternal care for unspecified type scar from previous cesarean delivery: Secondary | ICD-10-CM | POA: Diagnosis present

## 2021-09-14 DIAGNOSIS — O1490 Unspecified pre-eclampsia, unspecified trimester: Secondary | ICD-10-CM | POA: Diagnosis present

## 2021-09-14 DIAGNOSIS — R0602 Shortness of breath: Secondary | ICD-10-CM

## 2021-09-14 DIAGNOSIS — O34211 Maternal care for low transverse scar from previous cesarean delivery: Secondary | ICD-10-CM | POA: Diagnosis not present

## 2021-09-14 DIAGNOSIS — F17210 Nicotine dependence, cigarettes, uncomplicated: Secondary | ICD-10-CM | POA: Diagnosis present

## 2021-09-14 DIAGNOSIS — Z3490 Encounter for supervision of normal pregnancy, unspecified, unspecified trimester: Secondary | ICD-10-CM

## 2021-09-14 DIAGNOSIS — O99214 Obesity complicating childbirth: Secondary | ICD-10-CM | POA: Diagnosis present

## 2021-09-14 LAB — RESP PANEL BY RT-PCR (FLU A&B, COVID) ARPGX2
Influenza A by PCR: NEGATIVE
Influenza B by PCR: NEGATIVE
SARS Coronavirus 2 by RT PCR: NEGATIVE

## 2021-09-14 LAB — CBC
HCT: 37.1 % (ref 36.0–46.0)
Hemoglobin: 12.4 g/dL (ref 12.0–15.0)
MCH: 25.8 pg — ABNORMAL LOW (ref 26.0–34.0)
MCHC: 33.4 g/dL (ref 30.0–36.0)
MCV: 77.3 fL — ABNORMAL LOW (ref 80.0–100.0)
Platelets: 417 10*3/uL — ABNORMAL HIGH (ref 150–400)
RBC: 4.8 MIL/uL (ref 3.87–5.11)
RDW: 14.6 % (ref 11.5–15.5)
WBC: 13.8 10*3/uL — ABNORMAL HIGH (ref 4.0–10.5)
nRBC: 0 % (ref 0.0–0.2)

## 2021-09-14 LAB — COMPREHENSIVE METABOLIC PANEL
ALT: 13 U/L (ref 0–44)
AST: 19 U/L (ref 15–41)
Albumin: 2.6 g/dL — ABNORMAL LOW (ref 3.5–5.0)
Alkaline Phosphatase: 100 U/L (ref 38–126)
Anion gap: 10 (ref 5–15)
BUN: 7 mg/dL (ref 6–20)
CO2: 20 mmol/L — ABNORMAL LOW (ref 22–32)
Calcium: 8.4 mg/dL — ABNORMAL LOW (ref 8.9–10.3)
Chloride: 102 mmol/L (ref 98–111)
Creatinine, Ser: 0.46 mg/dL (ref 0.44–1.00)
GFR, Estimated: >60 mL/min (ref >60)
Glucose, Bld: 79 mg/dL (ref 70–99)
Potassium: 3.6 mmol/L (ref 3.5–5.1)
Sodium: 132 mmol/L — ABNORMAL LOW (ref 135–145)
Total Bilirubin: 0.1 mg/dL — ABNORMAL LOW (ref 0.3–1.2)
Total Protein: 6.4 g/dL — ABNORMAL LOW (ref 6.5–8.1)

## 2021-09-14 LAB — URINALYSIS, ROUTINE W REFLEX MICROSCOPIC
Bilirubin Urine: NEGATIVE
Glucose, UA: NEGATIVE mg/dL
Ketones, ur: NEGATIVE mg/dL
Leukocytes,Ua: NEGATIVE
Nitrite: NEGATIVE
Protein, ur: 30 mg/dL — AB
Specific Gravity, Urine: 1.025 (ref 1.005–1.030)
pH: 6 (ref 5.0–8.0)

## 2021-09-14 LAB — PROTEIN / CREATININE RATIO, URINE
Creatinine, Urine: 160.11 mg/dL
Protein Creatinine Ratio: 0.31 mg/mg{Cre} — ABNORMAL HIGH (ref 0.00–0.15)
Total Protein, Urine: 49 mg/dL

## 2021-09-14 LAB — URINALYSIS, MICROSCOPIC (REFLEX)

## 2021-09-14 LAB — TYPE AND SCREEN
ABO/RH(D): A POS
Antibody Screen: NEGATIVE

## 2021-09-14 LAB — GROUP B STREP BY PCR: Group B strep by PCR: POSITIVE — AB

## 2021-09-14 MED ORDER — PENICILLIN G POT IN DEXTROSE 60000 UNIT/ML IV SOLN
3.0000 10*6.[IU] | INTRAVENOUS | Status: DC
  Administered 2021-09-15 (×3): 3 10*6.[IU] via INTRAVENOUS
  Filled 2021-09-14 (×3): qty 50

## 2021-09-14 MED ORDER — ACETAMINOPHEN 325 MG PO TABS: 650.0000 mg | ORAL_TABLET | ORAL | Status: DC | PRN

## 2021-09-14 MED ORDER — LIDOCAINE HCL (PF) 1 % IJ SOLN: 30.0000 mL | INTRAMUSCULAR | Status: DC | PRN

## 2021-09-14 MED ORDER — FLEET ENEMA 7-19 GM/118ML RE ENEM: 1.0000 | ENEMA | RECTAL | Status: DC | PRN

## 2021-09-14 MED ORDER — FENTANYL CITRATE (PF) 100 MCG/2ML IJ SOLN
50.0000 ug | INTRAMUSCULAR | Status: DC | PRN
  Administered 2021-09-15: 100 ug via INTRAVENOUS
  Administered 2021-09-15: 50 ug via INTRAVENOUS
  Filled 2021-09-14 (×3): qty 2

## 2021-09-14 MED ORDER — OXYTOCIN BOLUS FROM INFUSION
333.0000 mL | Freq: Once | INTRAVENOUS | Status: DC
  Administered 2021-09-15: 333 mL via INTRAVENOUS

## 2021-09-14 MED ORDER — OXYTOCIN-SODIUM CHLORIDE 30-0.9 UT/500ML-% IV SOLN
2.5000 [IU]/h | INTRAVENOUS | Status: DC
  Administered 2021-09-15: 2.5 [IU]/h via INTRAVENOUS
  Filled 2021-09-14: qty 500

## 2021-09-14 MED ORDER — LACTATED RINGERS IV SOLN: 500.0000 mL | INTRAVENOUS | Status: DC | PRN

## 2021-09-14 MED ORDER — SODIUM CHLORIDE 0.9 % IV SOLN
5.0000 10*6.[IU] | Freq: Once | INTRAVENOUS | Status: AC
  Administered 2021-09-14: 5 10*6.[IU] via INTRAVENOUS
  Filled 2021-09-14: qty 5

## 2021-09-14 MED ORDER — SOD CITRATE-CITRIC ACID 500-334 MG/5ML PO SOLN
30.0000 mL | ORAL | Status: DC | PRN
  Administered 2021-09-15: 30 mL via ORAL
  Filled 2021-09-14: qty 30

## 2021-09-14 MED ORDER — LACTATED RINGERS IV SOLN
INTRAVENOUS | Status: DC
  Administered 2021-09-15: 950 mL via INTRAVENOUS

## 2021-09-14 MED ORDER — ONDANSETRON HCL 4 MG/2ML IJ SOLN: 4.0000 mg | Freq: Four times a day (QID) | INTRAMUSCULAR | Status: DC | PRN
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
## 2021-09-14 NOTE — MAU Note (Signed)
Pt stated she has been at work all day. C/O lower back pain. Also c/o abd cramping q 15-20 min. Good fetal movement reported and denies any vag bleeding or leaking.
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
## 2021-09-15 DIAGNOSIS — O99824 Streptococcus B carrier state complicating childbirth: Secondary | ICD-10-CM | POA: Diagnosis not present

## 2021-09-15 DIAGNOSIS — O1404 Mild to moderate pre-eclampsia, complicating childbirth: Secondary | ICD-10-CM | POA: Diagnosis not present

## 2021-09-15 DIAGNOSIS — Z3A36 36 weeks gestation of pregnancy: Secondary | ICD-10-CM | POA: Diagnosis not present

## 2021-09-15 DIAGNOSIS — O34211 Maternal care for low transverse scar from previous cesarean delivery: Secondary | ICD-10-CM | POA: Diagnosis not present

## 2021-09-15 LAB — RPR: RPR Ser Ql: NONREACTIVE

## 2021-09-15 LAB — CULTURE, BETA STREP (GROUP B ONLY)

## 2021-09-15 LAB — RAPID URINE DRUG SCREEN, HOSP PERFORMED
Amphetamines: NOT DETECTED
Barbiturates: NOT DETECTED
Benzodiazepines: NOT DETECTED
Cocaine: NOT DETECTED
Opiates: NOT DETECTED
Tetrahydrocannabinol: POSITIVE — AB

## 2021-09-15 MED ORDER — SIMETHICONE 80 MG PO CHEW: 80.0000 mg | CHEWABLE_TABLET | ORAL | Status: DC | PRN

## 2021-09-15 MED ORDER — BENZOCAINE-MENTHOL 20-0.5 % EX AERO
1.0000 "application " | INHALATION_SPRAY | CUTANEOUS | Status: DC | PRN
  Filled 2021-09-15: qty 56

## 2021-09-15 MED ORDER — DIBUCAINE (PERIANAL) 1 % EX OINT: 1.0000 "application " | TOPICAL_OINTMENT | CUTANEOUS | Status: DC | PRN

## 2021-09-15 MED ORDER — TERBUTALINE SULFATE 1 MG/ML IJ SOLN: 0.2500 mg | Freq: Once | INTRAMUSCULAR | Status: DC | PRN

## 2021-09-15 MED ORDER — OXYCODONE HCL 5 MG PO TABS: 5.0000 mg | ORAL_TABLET | ORAL | Status: DC | PRN

## 2021-09-15 MED ORDER — TETANUS-DIPHTH-ACELL PERTUSSIS 5-2.5-18.5 LF-MCG/0.5 IM SUSY: 0.5000 mL | PREFILLED_SYRINGE | Freq: Once | INTRAMUSCULAR | Status: DC

## 2021-09-15 MED ORDER — SENNOSIDES-DOCUSATE SODIUM 8.6-50 MG PO TABS
2.0000 | ORAL_TABLET | Freq: Every day | ORAL | Status: DC
  Administered 2021-09-16 – 2021-09-17 (×2): 2 via ORAL
  Filled 2021-09-15 (×2): qty 2

## 2021-09-15 MED ORDER — OXYTOCIN-SODIUM CHLORIDE 30-0.9 UT/500ML-% IV SOLN
1.0000 m[IU]/min | INTRAVENOUS | Status: DC
  Administered 2021-09-15: 1 m[IU]/min via INTRAVENOUS

## 2021-09-15 MED ORDER — OXYTOCIN-SODIUM CHLORIDE 30-0.9 UT/500ML-% IV SOLN
1.0000 m[IU]/min | INTRAVENOUS | Status: DC
  Administered 2021-09-15: 4 m[IU]/min via INTRAVENOUS

## 2021-09-15 MED ORDER — IBUPROFEN 600 MG PO TABS
600.0000 mg | ORAL_TABLET | Freq: Four times a day (QID) | ORAL | Status: DC
  Administered 2021-09-16 – 2021-09-17 (×6): 600 mg via ORAL
  Filled 2021-09-15 (×7): qty 1

## 2021-09-15 MED ORDER — WITCH HAZEL-GLYCERIN EX PADS: 1.0000 "application " | MEDICATED_PAD | CUTANEOUS | Status: DC | PRN

## 2021-09-15 MED ORDER — ACETAMINOPHEN 325 MG PO TABS: 650.0000 mg | ORAL_TABLET | ORAL | Status: DC | PRN

## 2021-09-15 MED ORDER — DIPHENHYDRAMINE HCL 25 MG PO CAPS: 25.0000 mg | ORAL_CAPSULE | Freq: Four times a day (QID) | ORAL | Status: DC | PRN

## 2021-09-15 MED ORDER — OXYCODONE HCL 5 MG PO TABS
10.0000 mg | ORAL_TABLET | ORAL | Status: DC | PRN
  Administered 2021-09-15 – 2021-09-17 (×3): 10 mg via ORAL
  Filled 2021-09-15 (×3): qty 2

## 2021-09-15 MED ORDER — ZOLPIDEM TARTRATE 5 MG PO TABS: 5.0000 mg | ORAL_TABLET | Freq: Every evening | ORAL | Status: DC | PRN

## 2021-09-15 MED ORDER — COCONUT OIL OIL: 1.0000 "application " | TOPICAL_OIL | Status: DC | PRN

## 2021-09-15 MED ORDER — PRENATAL MULTIVITAMIN CH
1.0000 | ORAL_TABLET | Freq: Every day | ORAL | Status: DC
  Administered 2021-09-16 – 2021-09-17 (×2): 1 via ORAL
  Filled 2021-09-15 (×2): qty 1

## 2021-09-15 MED ORDER — ONDANSETRON HCL 4 MG/2ML IJ SOLN: 4.0000 mg | INTRAMUSCULAR | Status: DC | PRN

## 2021-09-15 MED ORDER — ONDANSETRON HCL 4 MG PO TABS: 4.0000 mg | ORAL_TABLET | ORAL | Status: DC | PRN

## 2021-09-15 NOTE — Lactation Note (Signed)
This note was copied from a baby's chart. Lactation Consultation Note  Patient Name: Jennifer Cohen [id number] Date: 09/15/2021 Reason for consult: Initial assessment;Mother's request;Maternal endocrine disorder;Breastfeeding assistance Age:32 hours  Mom states she will d/c use of THC. Mom smokes.  We reviewed infant risks related to exposure to cigarette smoke. Mom aware of the importance of pumping first and then smoking afterwards, not smoking around infant and changing her clothes after when handling infant.   Mom feeding plan supplementing with EBM and formula.  Mom aware to feed by cues 8-12x 24hr period. Volume supplementation guide for formula fed infants provided and reviewed. Mom aware to give EBM first followed by formula, increase as tolerated.  Mom to post pump with dEBP q 3hrs for 15 min.  All questions answered at the end of the visit.   Maternal Data Has patient been taught Hand Expression?: Yes Does the patient have breastfeeding experience prior to this delivery?: Yes How long did the patient breastfeed?: 2 weeks  Feeding Mother's Current Feeding Choice: Breast Milk and Formula Nipple Type: Slow - flow (Infant emesis with use of slow flow nipple. White extra slow flow provided, reuse and clean in between with soap and water provided.)  LATCH Score                    Lactation Tools Discussed/Used Tools: Pump;Flanges Flange Size: 24;27 Breast pump type: Double-Electric Breast Pump Pump Education: Setup, frequency, and cleaning;Milk Storage Reason for Pumping: increase stimulation Pumping frequency: every 3 hrs for 15 min  Interventions Interventions: Breast feeding basics reviewed;Hand express;Expressed milk;DEBP;Education;LC Services brochure;Infant Driven Feeding Algorithm education  Discharge Pump: Manual  Consult Status Consult Status: Follow-up Date: 09/16/21 Follow-up type: In-patient    Jennifer Cen  Cohen 09/15/2021, 11:09  PM

## 2021-09-15 NOTE — Progress Notes (Signed)
RN observed the patient vaping in her room. RN informed patient that vaping was not permitted on hospital campus and posed a safety concern. Patient verbalized understanding. RN offered patient a nicotine patch. Patient declined. Darrelyn Hillock, MD notified of situation by phone.

## 2021-09-15 NOTE — Discharge Summary (Addendum)
Postpartum Discharge Summary    Patient Name: Taegen Lennox DOB: 07-Mar-1991 MRN: [id number]  Date of admission: 09/14/2021 Delivery date:09/15/2021  Delivering provider: Fatima Blank A  Date of discharge: 09/17/2021  Admitting diagnosis: Preeclampsia [O14.90] Pregnancy [Z34.90] Intrauterine pregnancy: [redacted]w[redacted]d    Secondary diagnosis:  Principal Problem:   Preeclampsia Active Problems:   Pregnancy  Additional problems: None    Discharge diagnosis: Term Pregnancy Delivered                                              Post partum procedures: none Augmentation: AROM, Pitocin, and IP Foley Complications: None  Hospital course: Induction of Labor With Vaginal Delivery   31y.o. yo [id number] 32w0das admitted to the hospital 09/14/2021 for induction of labor.  Indication for induction:  preeclampsia without severe features .  Patient had an uncomplicated labor course as follows: Membrane Rupture Time/Date: 2:01 PM ,09/15/2021   Delivery Method:Vaginal, Spontaneous  Episiotomy: None  Lacerations:  None  Details of delivery can be found in separate delivery note.  Patient had a routine postpartum course. Patient is discharged home 09/17/21.  Newborn Data: Birth date:09/15/2021  Birth time:4:55 PM  Gender:Female  Living status:Living  Apgars:8 ,9  Weight:2770 g   Magnesium Sulfate received: No BMZ received: No Rhophylac:No MMR:No T-DaP: declined Flu: No Transfusion:No  Physical exam  Vitals:   09/16/21 1437 09/16/21 1527 09/16/21 2055 09/17/21 0507  BP: (!) 131/91 117/74 135/83 129/84  Pulse: 91 97 (!) 106 (!) 110  Resp: '16 19 20 20  ' Temp: 98.1 F (36.7 C)   98.4 F (36.9 C)  TempSrc: Oral   Oral  SpO2: 100% 100% 100% 100%  Weight:      Height:       General: Well-appearing, alert, cooperative, and no distress sitting on bed with sister and baby at the bedside Lochia: appropriate Uterine Fundus: firm Incision: N/A DVT Evaluation: No evidence of DVT  seen on physical exam. No cords or calf tenderness. No significant calf/ankle edema. Labs: Lab Results  Component Value Date   WBC 13.8 (H) 09/14/2021   HGB 12.4 09/14/2021   HCT 37.1 09/14/2021   MCV 77.3 (L) 09/14/2021   PLT 417 (H) 09/14/2021   CMP Latest Ref Rng & Units 09/14/2021  Glucose 70 - 99 mg/dL 79  BUN 6 - 20 mg/dL 7  Creatinine 0.44 - 1.00 mg/dL 0.46  Sodium 135 - 145 mmol/L 132(L)  Potassium 3.5 - 5.1 mmol/L 3.6  Chloride 98 - 111 mmol/L 102  CO2 22 - 32 mmol/L 20(L)  Calcium 8.9 - 10.3 mg/dL 8.4(L)  Total Protein 6.5 - 8.1 g/dL 6.4(L)  Total Bilirubin 0.3 - 1.2 mg/dL 0.1(L)  Alkaline Phos 38 - 126 U/L 100  AST 15 - 41 U/L 19  ALT 0 - 44 U/L 13   Edinburgh Score: Edinburgh Postnatal Depression Scale Screening Tool 09/16/2021  I have been able to laugh and see the funny side of things. 0  I have looked forward with enjoyment to things. 0  I have blamed myself unnecessarily when things went wrong. 2  I have been anxious or worried for no good reason. 0  I have felt scared or panicky for no good reason. 1  Things have been getting on top of me. 0  I have been so unhappy that I have  had difficulty sleeping. 0  I have felt sad or miserable. 0  I have been so unhappy that I have been crying. 0  The thought of harming myself has occurred to me. 0  Edinburgh Postnatal Depression Scale Total 3     After visit meds:  Allergies as of 09/17/2021   No Known Allergies      Medication List     STOP taking these medications    aspirin 81 MG chewable tablet   cyclobenzaprine 10 MG tablet Commonly known as: FLEXERIL       TAKE these medications    acetaminophen 325 MG tablet Commonly known as: Tylenol Take 2 tablets (650 mg total) by mouth every 6 (six) hours as needed for up to 30 doses.   Blood Pressure Kit Devi 1 Device by Does not apply route once a week.   furosemide 20 MG tablet Commonly known as: LASIX Take 1 tablet (20 mg total) by mouth 2  (two) times daily for 4 days.   Gojji Weight Scale Misc 1 Device by Does not apply route once a week.   ibuprofen 600 MG tablet Commonly known as: ADVIL Take 1 tablet (600 mg total) by mouth every 6 (six) hours as needed.   multivitamin-prenatal 27-0.8 MG Tabs tablet Take 1 tablet by mouth daily at 12 noon.   NIFEdipine 30 MG 24 hr tablet Commonly known as: ADALAT CC Take 1 tablet (30 mg total) by mouth daily.         Discharge home in stable condition Infant Feeding: Bottle Infant Disposition:home with mother Discharge instruction: per After Visit Summary and Postpartum booklet. Activity: Advance as tolerated. Pelvic rest for 6 weeks.  Diet: routine diet Future Appointments: Future Appointments  Date Time Provider Day  09/23/2021  9:30 AM Lynnea Ferrier, LCSW [facility] None  09/23/2021 10:20 AM [facility] NURSE [facility] None  10/16/2021  8:35 AM Chancy Milroy, MD [facility] None   Follow up Visit:  Message sent to [place] on 09/15/21:  Please schedule this patient for a In person postpartum visit in 4 weeks with the following provider: MD and PP visit AND preop visit for interval BTL . Additional Postpartum F/U:BP check 1 week  High risk pregnancy complicated by:  Hx PTD, GDM, shoulder dystocia. HTN this pregnancy. Delivery mode:  Vaginal, Spontaneous  Anticipated Birth Control:  Plans Interval BTL.    09/17/2021 Fabiola Backer, MD  OB DISCHARGE ATTESTATION  I have seen and examined this patient and agree with above documentation in the resident's note.   Mallie Snooks, MSA, MSN, CNM Certified Nurse Midwife, [facility] for [employer], [facility] 09/17/21 9:36 AM

## 2021-09-15 NOTE — Progress Notes (Signed)
Jennifer Cohen is a 31 y.o. N8G9562 at [redacted]w[redacted]d by LMP admitted for induction of labor due to [facility] without severe features.  Subjective: Pt feeling contractions, reports more comfortable with IV pain medication.  Family in room for support.   Objective: BP 128/79    Pulse 92    Temp 98.2 F (36.8 C) (Oral)    Resp 17    Ht 5' (1.524 m)    Wt 97.5 kg    LMP 12/30/2020 (Approximate)    SpO2 100%    BMI 41.99 kg/m  No intake/output data recorded. No intake/output data recorded.  FHT:  FHR: 135 bpm, variability: moderate,  accelerations:  Present,  decelerations:  Absent UC:   regular, every 2-3 minutes, mild to palpation SVE:   Dilation: 5 Effacement (%): 60 Station: -1 Exam by:: Misty Stanley K. CNM AROM with clear fluid, pt tolerated well  Labs: Lab Results  Component Value Date   WBC 13.8 (H) 09/14/2021   HGB 12.4 09/14/2021   HCT 37.1 09/14/2021   MCV 77.3 (L) 09/14/2021   PLT 417 (H) 09/14/2021    Assessment / Plan: Augmentation of labor, progressing well GBS positive, on PCN  Labor: Progressing normally and discussed options including continuing current course with Pitocin, and/or AROM for augmentation. Pt elected to have AROM at this time.  Contractions tracing well on toco per RN so will not place IUPC at this time, consider if arrest of progress or difficulty tracing ctx. Preeclampsia:  labs stable Fetal Wellbeing:  Category I Pain Control:  IV pain meds I/D:   GBS pos on PCN Anticipated MOD:   VBAC  Sharen Counter 09/15/2021, 2:04 PM

## 2021-09-15 NOTE — Progress Notes (Signed)
Labor Progress Note Jennifer Cohen is a 31 y.o. [id number] at [redacted]w[redacted]d presented for IOL due to pre-e without severe features.   S: Doing well, feeling some cramping.   O:  BP 130/76    Pulse 100    Temp 98.2 F (36.8 C) (Oral)    Resp 17    Ht 5' (1.524 m)    Wt 97.5 kg    LMP 12/30/2020 (Approximate)    SpO2 100%    BMI 41.99 kg/m  EFM: 130/mod/15x15/none  CVE: Dilation: 2.5 Presentation: Vertex Exam by:: Darrelyn Hillock    A&P: 31 y.o. [id number] [redacted]w[redacted]d  #Labor: FB still in place, however coming down more and hopeful it should fall out soon. Will add low dose pit 1x1 up to 6 while balloon still in.  #Pain: PRN #FWB: Cat I  #GBS positive PCN  Patriciaann Clan, DO

## 2021-09-15 NOTE — Progress Notes (Signed)
Jennifer Cohen is a 31 y.o. L2G4010 at [redacted]w[redacted]d by LMP c/w early [place] admitted for induction of labor due to PEC w/o SF. She has hx C/S x 1 followed by successful VBAC x 3 with shoulder dystocia with last pregnancy.  Subjective: Pt feeling contractions, reports mild pain with them. Desires to avoid epidural this labor.  Family in room for support.  Objective: BP (!) 127/92    Pulse 92    Temp 98.2 F (36.8 C) (Oral)    Resp 17    Ht 5' (1.524 m)    Wt 97.5 kg    LMP 12/30/2020 (Approximate)    SpO2 100%    BMI 41.99 kg/m  No intake/output data recorded. No intake/output data recorded.  FHT:  FHR: 135 bpm, variability: moderate,  accelerations:  Present,  decelerations:  Absent UC:   irregular, every 3-10 minutes, mild to palpation SVE:   Dilation: 4 Effacement (%): 50 Station: -2 Exam by:: Sherol Dade RN  Labs: Lab Results  Component Value Date   WBC 13.8 (H) 09/14/2021   HGB 12.4 09/14/2021   HCT 37.1 09/14/2021   MCV 77.3 (L) 09/14/2021   PLT 417 (H) 09/14/2021    Assessment / Plan: Induction of labor due to PEC,  progressing well on pitocin FB out at 0900, Pitocin ordered to increase by 2 milliunits per protocol  Labor: Progressing normally Preeclampsia:  no signs or symptoms of toxicity Fetal Wellbeing:  Category I Pain Control:  Labor support without medications I/D:   GBS positive on PCN Anticipated MOD:   VBAC  Sharen Counter 09/15/2021, 10:43 AM

## 2021-09-15 NOTE — Plan of Care (Signed)
°  Problem: Education: Goal: Knowledge of Childbirth will improve Outcome: Progressing Goal: Ability to make informed decisions regarding treatment and plan of care will improve Outcome: Progressing Goal: Ability to state and carry out methods to decrease the pain will improve Outcome: Progressing Goal: Individualized Educational Video(s) Outcome: Progressing   Problem: Coping: Goal: Ability to verbalize concerns and feelings about labor and delivery will improve Outcome: Progressing   Problem: Role Relationship: Goal: Will demonstrate positive interactions with the child Outcome: Progressing   Problem: Safety: Goal: Risk of complications during labor and delivery will decrease Outcome: Progressing   Problem: Pain Management: Goal: Relief or control of pain from uterine contractions will improve Outcome: Progressing   Problem: Education: Goal: Knowledge of General Education information will improve Description: Including pain rating scale, medication(s)/side effects and non-pharmacologic comfort measures Outcome: Progressing   Problem: Clinical Measurements: Goal: Ability to maintain clinical measurements within normal limits will improve Outcome: Progressing Goal: Will remain free from infection Outcome: Progressing Goal: Diagnostic test results will improve Outcome: Progressing Goal: Respiratory complications will improve Outcome: Progressing Goal: Cardiovascular complication will be avoided Outcome: Progressing   Problem: Activity: Goal: Risk for activity intolerance will decrease Outcome: Progressing   Problem: Coping: Goal: Level of anxiety will decrease Outcome: Progressing   Problem: Elimination: Goal: Will not experience complications related to bowel motility Outcome: Progressing Goal: Will not experience complications related to urinary retention Outcome: Progressing   Problem: Pain Managment: Goal: General experience of comfort will  improve Outcome: Progressing   Problem: Safety: Goal: Ability to remain free from injury will improve Outcome: Progressing   Problem: Skin Integrity: Goal: Risk for impaired skin integrity will decrease Outcome: Progressing

## 2021-09-16 ENCOUNTER — Ambulatory Visit: Payer: Medicaid Other | Admitting: Licensed Clinical Social Worker

## 2021-09-16 ENCOUNTER — Other Ambulatory Visit: Payer: Medicaid Other

## 2021-09-16 ENCOUNTER — Ambulatory Visit: Payer: Medicaid Other

## 2021-09-16 ENCOUNTER — Encounter: Payer: Medicaid Other | Admitting: Obstetrics and Gynecology

## 2021-09-16 LAB — GC/CHLAMYDIA PROBE AMP (~~LOC~~) NOT AT ARMC
Chlamydia: NEGATIVE
Comment: NEGATIVE
Comment: NORMAL
Neisseria Gonorrhea: NEGATIVE

## 2021-09-16 MED ORDER — NIFEDIPINE ER OSMOTIC RELEASE 30 MG PO TB24
30.0000 mg | ORAL_TABLET | Freq: Every day | ORAL | Status: DC
  Administered 2021-09-16 – 2021-09-17 (×2): 30 mg via ORAL
  Filled 2021-09-16 (×2): qty 1

## 2021-09-16 MED ORDER — FUROSEMIDE 20 MG PO TABS
20.0000 mg | ORAL_TABLET | Freq: Two times a day (BID) | ORAL | Status: DC
  Administered 2021-09-16 – 2021-09-17 (×3): 20 mg via ORAL
  Filled 2021-09-16 (×3): qty 1

## 2021-09-16 NOTE — Progress Notes (Signed)
Rounding attempted. Patient off unit. Report received from bedside RN.  Patient to receive Procardia XL 30 mg this morning.  Order placed for SW consult Will attempt to round later this morning  Clayton Bibles, MSA, MSN, CNM Certified Nurse Midwife, [facility] for [employer], [facility] 09/16/21 8:57 AM

## 2021-09-16 NOTE — Lactation Note (Signed)
This note was copied from a baby's chart. Lactation Consultation Note  Patient Name: Jennifer Cohen [id number] Date: 09/16/2021 Reason for consult: Follow-up assessment;Early term 37-38.6wks Age:32 hours   LC Note:  Attempted to visit with family, however, all members were asleep.  LC will follow up tomorrow.   Maternal Data    Feeding Nipple Type: Nfant Standard Flow (white)  LATCH Score                    Lactation Tools Discussed/Used    Interventions    Discharge    Consult Status Consult Status: Follow-up Date: 09/17/21 Follow-up type: In-patient    Dora Sims 09/16/2021, 5:52 PM

## 2021-09-16 NOTE — Lactation Note (Signed)
This note was copied from Jennifer baby's chart. Lactation Consultation Note  Patient Name: Jennifer Cohen [id number] Date: 09/16/2021 Age:32 hours  Mom and baby are sleeping upon visit. LC will come back to room at another time as possible.     Feeding Nipple Type: Nfant Standard Flow (white)  Jennifer Cohen Jennifer Cohen Ancidey 09/16/2021, 5:21 PM
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
## 2021-09-16 NOTE — Progress Notes (Signed)
Post Partum Day 1  Subjective: no complaints, up ad lib, voiding, tolerating PO, and + flatus  Objective: Blood pressure (!) 137/95, pulse 84, temperature 98.4 F (36.9 C), temperature source Oral, resp. rate 20, height 5' (1.524 m), weight 97.5 kg, last menstrual period 12/30/2020, SpO2 100 %, unknown if currently breastfeeding.  Physical Exam:  General: alert, cooperative, appears stated age, and no distress Lochia: appropriate Uterine Fundus: firm Incision: N/A DVT Evaluation: No evidence of DVT seen on physical exam.  Recent Labs    09/14/21 2110  HGB 12.4  HCT 37.1    Assessment/Plan: Social Work consult ordered, patient reassurance provided Procardia XL 30 mg ordered  Plan for discharge tomorrow    LOS: 2 days   Darlina Rumpf, CNM 09/16/2021, 10:02 AM
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
## 2021-09-17 ENCOUNTER — Other Ambulatory Visit (HOSPITAL_COMMUNITY): Payer: Self-pay

## 2021-09-17 MED ORDER — NIFEDIPINE ER 30 MG PO TB24
30.0000 mg | ORAL_TABLET | Freq: Every day | ORAL | 0 refills | Status: DC
  Filled 2021-09-17: qty 30, 30d supply, fill #0

## 2021-09-17 MED ORDER — FUROSEMIDE 20 MG PO TABS
20.0000 mg | ORAL_TABLET | Freq: Two times a day (BID) | ORAL | 0 refills | Status: DC
  Filled 2021-09-17: qty 8, 4d supply, fill #0

## 2021-09-17 MED ORDER — IBUPROFEN 600 MG PO TABS
600.0000 mg | ORAL_TABLET | Freq: Four times a day (QID) | ORAL | 0 refills | Status: DC | PRN
  Filled 2021-09-17: qty 30, 8d supply, fill #0

## 2021-09-17 NOTE — Lactation Note (Signed)
This note was copied from a baby's chart. Lactation Consultation Note  Patient Name: Hannah Strader BULAG'T Date: 09/17/2021 Reason for consult: Follow-up assessment;Early term 37-38.6wks;Other (Comment) (per [facility] mom has changed to formula and doesn't desire assistance with breastfeeding.) Age:32 hours  Maternal Data    Feeding Mother's Current Feeding Choice: Formula Nipple Type: Slow - flow  LATCH Score                    Lactation Tools Discussed/Used    Interventions    Discharge    Consult Status Consult Status: Complete Date: 09/17/21    Kathrin Greathouse 09/17/2021, 10:32 AM

## 2021-09-17 NOTE — Discharge Instructions (Signed)

## 2021-09-23 ENCOUNTER — Ambulatory Visit: Payer: Medicaid Other

## 2021-09-23 ENCOUNTER — Telehealth: Payer: Self-pay | Admitting: Licensed Clinical Social Worker

## 2021-09-23 ENCOUNTER — Encounter: Payer: Medicaid Other | Admitting: Licensed Clinical Social Worker

## 2021-09-23 NOTE — Telephone Encounter (Signed)
Called pt regarding scheduled mychart visit. Left message requesting callback. Also sent mychart link to patient cell phone

## 2021-09-24 ENCOUNTER — Ambulatory Visit: Payer: Medicaid Other

## 2021-09-30 ENCOUNTER — Telehealth (HOSPITAL_COMMUNITY): Payer: Self-pay | Admitting: *Deleted

## 2021-09-30 NOTE — Telephone Encounter (Signed)
Phone voicemail message left to return nurse call.  Duffy Rhody, RN 09-30-21 at 11:56am

## 2021-10-16 ENCOUNTER — Ambulatory Visit: Payer: Medicaid Other | Admitting: Obstetrics and Gynecology

## 2021-10-30 ENCOUNTER — Other Ambulatory Visit: Payer: Self-pay

## 2021-10-30 ENCOUNTER — Ambulatory Visit (INDEPENDENT_AMBULATORY_CARE_PROVIDER_SITE_OTHER): Payer: Medicaid Other | Admitting: Obstetrics and Gynecology

## 2021-10-30 ENCOUNTER — Encounter: Payer: Self-pay | Admitting: Obstetrics and Gynecology

## 2021-10-30 VITALS — BP 154/89 | HR 80 | Ht 60.0 in | Wt 199.0 lb

## 2021-10-30 DIAGNOSIS — Z3009 Encounter for other general counseling and advice on contraception: Secondary | ICD-10-CM

## 2021-10-30 DIAGNOSIS — R87610 Atypical squamous cells of undetermined significance on cytologic smear of cervix (ASC-US): Secondary | ICD-10-CM | POA: Diagnosis not present

## 2021-10-30 DIAGNOSIS — I10 Essential (primary) hypertension: Secondary | ICD-10-CM | POA: Insufficient documentation

## 2021-10-30 DIAGNOSIS — R87810 Cervical high risk human papillomavirus (HPV) DNA test positive: Secondary | ICD-10-CM

## 2021-10-30 DIAGNOSIS — I159 Secondary hypertension, unspecified: Secondary | ICD-10-CM | POA: Diagnosis not present

## 2021-10-30 MED ORDER — MEDROXYPROGESTERONE ACETATE 150 MG/ML IM SUSP
150.0000 mg | Freq: Once | INTRAMUSCULAR | Status: AC
  Administered 2021-10-30: 150 mg via INTRAMUSCULAR

## 2021-10-30 MED ORDER — HYDROCHLOROTHIAZIDE 25 MG PO TABS: 25.0000 mg | ORAL_TABLET | Freq: Every day | ORAL | 3 refills | Status: DC

## 2021-10-30 NOTE — Patient Instructions (Addendum)
Laparoscopic Tubal Ligation, Care After °The following information offers guidance on how to care for yourself after your procedure. Your health care provider may also give you more specific instructions. If you have problems or questions, contact your health care provider. °What can I expect after the procedure? °After the procedure, it is common to have: °A sore throat if general anesthesia was used. °Pain in shoulders, back, and abdomen. This is caused by the gas that was used during the procedure. °Mild discomfort or cramping in your abdomen. °Pain or soreness in the area where the surgical incision was made. °A bloated feeling. °Tiredness. °Nausea and vomiting. °Follow these instructions at home: °Medicines °Take over-the-counter and prescription medicines only as told by your health care provider. °Ask your health care provider if the medicine prescribed to you: °Requires you to avoid driving or using heavy machinery. °Can cause constipation. You may need to take these actions to prevent or treat constipation: °Drink enough fluid to keep your urine pale yellow. °Take over-the-counter or prescription medicines. °Eat foods that are high in fiber, such as beans, whole grains, and fresh fruits and vegetables. °Limit foods that are high in fat and processed sugars, such as fried or sweet foods. °Do not take aspirin because it can cause bleeding. °Incision care °  °Follow instructions from your health care provider about how to take care of your incision. Make sure you: °Wash your hands with soap and water for at least 20 seconds before and after you change your bandage (dressing). If soap and water are not available, use hand sanitizer. °Change your dressing as told by your health care provider. °Leave stitches (sutures), skin glue, or adhesive strips in place. These skin closures may need to stay in place for 2 weeks or longer. If adhesive strip edges start to loosen and curl up, you may trim the loose edges. Do  not remove adhesive strips completely unless your health care provider tells you to do that. °Check your incision area every day for signs of infection. Check for: °Redness, swelling, or more pain. °Fluid or blood. °Warmth. °Pus or a bad smell. °Activity °Rest as told by your health care provider. °Avoid sitting for a long time without moving. Get up to take short walks every 1-2 hours. This is important to improve blood flow and breathing. Ask for help if you feel weak or unsteady. °Do not have sex, douche, or put a tampon or anything else in your vagina for 6 weeks or as long as told by your health care provider. °Do not lift anything that is heavier than your baby for 2 weeks, or the limit that you are told, until your health care provider says that it is safe. °Do not take baths, swim, or use a hot tub until your health care provider approves. Ask your health care provider if you may take showers. You may only be allowed to take sponge baths. °Return to your normal activities as told by your health care provider. Ask your health care provider what activities are safe for you. °General instructions °After the procedure you may need to wear a sanitary pad for vaginal discharge. °Have someone help you with your daily household tasks for the first few days. °Keep all follow-up visits. This is important. °Contact a health care provider if: °You have redness, swelling, or more pain around your incision. °Your incision feels warm to the touch. °You have pus or a bad smell coming from your incision. °The edges of your incision break   open after the sutures have been removed. Your pain does not improve after 2-3 days. You have a rash. You repeatedly become dizzy or light-headed. Your pain medicine is not helping. Get help right away if: You have a fever or chills. You faint. You have increasing pain in your abdomen. You have severe pain in one or both of your shoulders. You have fluid or blood coming from your  sutures or heavy bleeding from your vagina. You have shortness of breath or difficulty breathing. You have chest pain, leg pain, or leg swelling. You have ongoing nausea, vomiting, or diarrhea. These symptoms may represent a serious problem that is an emergency. Do not wait to see if the symptoms will go away. Get medical help right away. Call your local emergency services (911 in the [place] Do not drive yourself to the hospital. Summary After the procedure, it is common to have mild discomfort or cramping in your abdomen. After the procedure you may need to wear a sanitary pad for vaginal discharge. Take over-the-counter and prescription medicines only as told by your health care provider. Watch for symptoms that should prompt you to call your health care provider. Keep all follow-up visits. This is important. This information is not intended to replace advice given to you by your health care provider. Make sure you discuss any questions you have with your health care provider. Document Revised: 05/04/2020 Document Reviewed: 05/04/2020 Elsevier Patient Education  2022 [employer] Laparoscopic Tubal Ligation Laparoscopic tubal ligation is a procedure to close the fallopian tubes. This is done to prevent pregnancy. When the fallopian tubes are closed, the eggs that your ovaries release cannot enter the uterus, and sperm cannot reach the released eggs. You should not have this procedure if you want to get pregnant someday or if you are unsure about having more children. Tell a health care provider about: Any allergies you have. All medicines you are taking, including vitamins, herbs, eye drops, creams, and over-the-counter medicines. Any problems you or family members have had with anesthetic medicines. Any blood disorders you have. Any surgeries you have had. Any medical conditions you have. Whether you are pregnant or may be pregnant. Any past pregnancies. What are the risks? Generally,  this is a safe procedure. However, problems may occur, including: Infection. Bleeding. Injury to other organs in the abdomen. Side effects from anesthetic medicines. Failure of the procedure. This procedure can increase your risk of an ectopic pregnancy. This is a pregnancy in which a fertilized egg attaches to the outside of the uterus. What happens before the procedure? Staying hydrated Follow instructions from your health care provider about hydration, which may include: Up to 2 hours before the procedure - you may continue to drink clear liquids, such as water, clear fruit juice, black coffee, and plain tea. Eating and drinking restrictions Follow instructions from your health care provider about eating and drinking, which may include: 8 hours before the procedure - stop eating heavy meals or foods, such as meat, fried foods, or fatty foods. 6 hours before the procedure - stop eating light meals or foods, such as toast or cereal. 6 hours before the procedure - stop drinking milk or drinks that contain milk. 2 hours before the procedure - stop drinking clear liquids. Medicines Ask your health care provider about: Changing or stopping your regular medicines. This is especially important if you are taking diabetes medicines or blood thinners. Taking medicines such as aspirin and ibuprofen. These medicines can thin your blood. Do  not take these medicines unless your health care provider tells you to take them. Taking over-the-counter medicines, vitamins, herbs, and supplements. Surgery safety Ask your health care provider: How your surgery site will be marked. What steps will be taken to help prevent infection. These steps may include: Removing hair at the surgery site. Washing skin with a germ-killing soap. Taking antibiotic medicine. General instructions Do not use any products that contain nicotine or tobacco for at least 4 weeks before the procedure. These products include  cigarettes, chewing tobacco, and vaping devices, such as e-cigarettes. If you need help quitting, ask your health care provider. Plan to have someone take you home from the hospital. If you will be going home right after the procedure, plan to have a responsible adult care for you for the time you are told. This is important. What happens during the procedure?   An IV will be inserted into one of your veins. You will be given one or more of the following: A medicine to help you relax (sedative). A medicine to numb the area (local anesthetic). A medicine to make you fall asleep (general anesthetic). A medicine that is injected into an area of your body to numb everything below the injection site (regional anesthetic). Your bladder may be emptied with a small tube (catheter). If you have been given a general anesthetic, a tube will be put down your throat to help you breathe. Two small incisions will be made in your lower abdomen and near your belly button. Your abdomen will be inflated with a gas. This will let the surgeon see better and will give the surgeon room to work. A lighted tube with camera (laparoscope) will be inserted into your abdomen through one of the incisions. Small instruments will be inserted through the other incision. The fallopian tubes will be tied off, burned (cauterized), or blocked with a clip, ring, or clamp. A small portion in the center of each fallopian tube may be removed. The gas will be released from the abdomen. The incisions will be closed with stitches (sutures). A bandage (dressing) will be placed over the incisions. The procedure may vary among health care providers and hospitals. What happens after the procedure? Your blood pressure, heart rate, breathing rate, and blood oxygen level will be monitored until you leave the hospital. You will be given medicine to help with pain, nausea, and vomiting as needed. You may have vaginal discharge after the  procedure. You may need to wear a sanitary napkin. If you were given a sedative during the procedure, it can affect you for several hours. Do not drive or operate machinery until your health care provider says that it is safe. Summary Laparoscopic tubal ligation is a procedure that is done to prevent pregnancy. You should not have this procedure if you want to get pregnant someday or if you are unsure about having more children. The procedure is done using a thin, lighted tube (laparoscope) with a camera attached that will be inserted into your abdomen through an incision. After the procedure you will be given medicine to help with pain, nausea, and vomiting as needed. Plan to have someone take you home from the hospital. This information is not intended to replace advice given to you by your health care provider. Make sure you discuss any questions you have with your health care provider. Document Revised: 05/04/2020 Document Reviewed: 05/04/2020 Elsevier Patient Education  2022 [employer] Health Maintenance, Female Adopting a healthy lifestyle and getting preventive care  are important in promoting health and wellness. Ask your health care provider about: The right schedule for you to have regular tests and exams. Things you can do on your own to prevent diseases and keep yourself healthy. What should I know about diet, weight, and exercise? Eat a healthy diet  Eat a diet that includes plenty of vegetables, fruits, low-fat dairy products, and lean protein. Do not eat a lot of foods that are high in solid fats, added sugars, or sodium. Maintain a healthy weight Body mass index (BMI) is used to identify weight problems. It estimates body fat based on height and weight. Your health care provider can help determine your BMI and help you achieve or maintain a healthy weight. Get regular exercise Get regular exercise. This is one of the most important things you can do for your health. Most  adults should: Exercise for at least 150 minutes each week. The exercise should increase your heart rate and make you sweat (moderate-intensity exercise). Do strengthening exercises at least twice a week. This is in addition to the moderate-intensity exercise. Spend less time sitting. Even light physical activity can be beneficial. Watch cholesterol and blood lipids Have your blood tested for lipids and cholesterol at 32 years of age, then have this test every 5 years. Have your cholesterol levels checked more often if: Your lipid or cholesterol levels are high. You are older than 32 years of age. You are at high risk for heart disease. What should I know about cancer screening? Depending on your health history and family history, you may need to have cancer screening at various ages. This may include screening for: Breast cancer. Cervical cancer. Colorectal cancer. Skin cancer. Lung cancer. What should I know about heart disease, diabetes, and high blood pressure? Blood pressure and heart disease High blood pressure causes heart disease and increases the risk of stroke. This is more likely to develop in people who have high blood pressure readings or are overweight. Have your blood pressure checked: Every 3-5 years if you are 53-82 years of age. Every year if you are 1 years old or older. Diabetes Have regular diabetes screenings. This checks your fasting blood sugar level. Have the screening done: Once every three years after age 73 if you are at a normal weight and have a low risk for diabetes. More often and at a younger age if you are overweight or have a high risk for diabetes. What should I know about preventing infection? Hepatitis B If you have a higher risk for hepatitis B, you should be screened for this virus. Talk with your health care provider to find out if you are at risk for hepatitis B infection. Hepatitis C Testing is recommended for: Everyone born from [birth year]  through 1966. Anyone with known risk factors for hepatitis C. Sexually transmitted infections (STIs) Get screened for STIs, including gonorrhea and chlamydia, if: You are sexually active and are younger than 32 years of age. You are older than 32 years of age and your health care provider tells you that you are at risk for this type of infection. Your sexual activity has changed since you were last screened, and you are at increased risk for chlamydia or gonorrhea. Ask your health care provider if you are at risk. Ask your health care provider about whether you are at high risk for HIV. Your health care provider may recommend a prescription medicine to help prevent HIV infection. If you choose to take medicine to prevent HIV,  you should first get tested for HIV. You should then be tested every 3 months for as long as you are taking the medicine. Pregnancy If you are about to stop having your period (premenopausal) and you may become pregnant, seek counseling before you get pregnant. Take 400 to 800 micrograms (mcg) of folic acid every day if you become pregnant. Ask for birth control (contraception) if you want to prevent pregnancy. Osteoporosis and menopause Osteoporosis is a disease in which the bones lose minerals and strength with aging. This can result in bone fractures. If you are 19 years old or older, or if you are at risk for osteoporosis and fractures, ask your health care provider if you should: Be screened for bone loss. Take a calcium or vitamin D supplement to lower your risk of fractures. Be given hormone replacement therapy (HRT) to treat symptoms of menopause. Follow these instructions at home: Alcohol use Do not drink alcohol if: Your health care provider tells you not to drink. You are pregnant, may be pregnant, or are planning to become pregnant. If you drink alcohol: Limit how much you have to: 0-1 drink a day. Know how much alcohol is in your drink. In the [place] one  drink equals one 12 oz bottle of beer (355 mL), one 5 oz glass of wine (148 mL), or one 1 oz glass of hard liquor (44 mL). Lifestyle Do not use any products that contain nicotine or tobacco. These products include cigarettes, chewing tobacco, and vaping devices, such as e-cigarettes. If you need help quitting, ask your health care provider. Do not use street drugs. Do not share needles. Ask your health care provider for help if you need support or information about quitting drugs. General instructions Schedule regular health, dental, and eye exams. Stay current with your vaccines. Tell your health care provider if: You often feel depressed. You have ever been abused or do not feel safe at home. Summary Adopting a healthy lifestyle and getting preventive care are important in promoting health and wellness. Follow your health care provider's instructions about healthy diet, exercising, and getting tested or screened for diseases. Follow your health care provider's instructions on monitoring your cholesterol and blood pressure. This information is not intended to replace advice given to you by your health care provider. Make sure you discuss any questions you have with your health care provider. Document Revised: 01/07/2021 Document Reviewed: 01/07/2021 Elsevier Patient Education  2022 [employer].

## 2021-10-30 NOTE — Progress Notes (Signed)
? ? ?Post Partum Visit Note ? ?Jennifer Cohen is a 31 y.o. [id number] female who presents for a postpartum visit. She is 6 weeks postpartum following a normal spontaneous vaginal delivery.  I have fully reviewed the prenatal and intrapartum course. The delivery was at 37 gestational weeks.  Anesthesia: none. Postpartum course has been uncomplicated. Baby is doing well. Baby is feeding by bottle Rush Barer . Bleeding no bleeding. Bowel function is normal. Bladder function is normal. Patient is not sexually active. Contraception method is none. Postpartum depression screening: positive. ? ? ?The pregnancy intention screening data noted above was reviewed. Potential methods of contraception were discussed. The patient elected to proceed with No data recorded. ? ? ? ?Health Maintenance Due  ?Topic Date Due  ? COVID-19 Vaccine (1) Never done  ? INFLUENZA VACCINE  Never done  ? ? ?Medical records ? ?Review of Systems ?Pertinent items are noted in HPI. ? ?Objective:  ?LMP 12/30/2020 (Approximate)   ? ?General:  alert  ? Breasts:  NA  ?Lungs: clear to auscultation bilaterally  ?Heart:  regular rate and rhythm, S1, S2 normal, no murmur, click, rub or gallop  ?Abdomen: soft, non-tender; bowel sounds normal; no masses,  no organomegaly   ?Wound NA  ?GU exam:  not indicated  ?     ?Assessment:  ? ? There are no diagnoses linked to this encounter. ? ?NL postpartum exam.  ?Unwanted fertility ?Abnormal pap smear ?HTN ? ?Plan:  ? ?Essential components of care per ACOG recommendations: ? ?1.  Mood and well being: Patient with negative depression screening today. Reviewed local resources for support.  ?- Patient tobacco use? No.   ?- hx of drug use? No.   ? ?2. Infant care and feeding:  ?-Patient currently breastmilk feeding? No.  ?-Social determinants of health (SDOH) reviewed in EPIC. No concerns ? ?3. Sexuality, contraception and birth spacing ?- Patient  does not  want a pregnancy in the next year.  Desired family size is  completed  ?- Reviewed forms of contraception in tiered fashion. Patient desired Depo-Provera today.   ?BTL papers signed today ? ?4. Sleep and fatigue ?-Encouraged family/partner/community support of 4 hrs of uninterrupted sleep to help with mood and fatigue ? ?5. Physical Recovery  ?- Discussed patients delivery and complications. She describes her labor as good. ?- Patient had a Vaginal, no problems at delivery. Patient had a 1st degree laceration. Perineal healing reviewed. Patient expressed understanding ?- Patient has urinary incontinence? No. ?- Patient is safe to resume physical and sexual activity ? ?6.  Health Maintenance ?- HM due items addressed Yes ?- Last pap smear  ?Diagnosis  ?Date Value Ref Range Status  ?04/10/2021 (A)  Final  ? - Atypical squamous cells of undetermined significance (ASC-US)  ? Pap smear not done at today's visit.  ?-Breast Cancer screening indicated? No.  ? ?7. Chronic Disease/Pregnancy Condition follow up: Hypertension ? ? ?Will start HCTZ.  Referral to FM. BTL papers today. Depo Provera in the mean time.  ? ?Patient desires bilateral tubal sterilization.  Other reversible forms of contraception were discussed with patient; she declines all other modalities. Discussed bilateral tubal sterilization in detail; discussed options of laparoscopic bilateral tubal sterilization using Filshie clips vs laparoscopic bilateral salpingectomy. Risks and benefits discussed in detail including but not limited to: risk of regret, permanence of method, bleeding, infection, injury to surrounding organs and need for additional procedures.  Failure risk of 1-2 % for Filshie clips and <1% for bilateral salpingectomy  with increased risk of ectopic gestation if pregnancy occurs was also discussed with patient.  Also discussed possible reduction of risk of ovarian cancer via bilateral salpingectomy given that a growing body of knowledge reveals that the majority of cases of high grade serous ?ovarian?  cancer actually are actually  cancers arising from the fimbriated end of the fallopian tubes. Emphasized that removal of fallopian tubes do not result in any known hormonal imbalance.  Patient verbalized understanding of these risks and benefits and wants to proceed with sterilization with laparoscopic bilateral sterilization using Filshie clips.   ? ?She was told that she will be contacted by our surgical scheduler regarding the time and date of her surgery; routine preoperative instructions of having nothing to eat or drink after midnight on the day prior to surgery and also coming to the hospital 1 1/2 hours prior to her time of surgery were also emphasized.  She was told she may be called for a preoperative appointment about a week prior to surgery and will be given further preoperative instructions at that visit.  Routine postoperative instructions will be reviewed with the patient and her family in detail after surgery. Printed patient education handouts about the procedure was given to the patient to review at home. ? ?Medicaid papers have been signed , patient understands that surgery will be scheduled at least 30 days after the day papers are signed as per Medicaid guidelines. ? ?In the meantime, patient will use depo provera for contraception prior to surgery. ?  ? ?Nettie Elm, MD ?
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
## 2021-12-23 ENCOUNTER — Encounter (HOSPITAL_COMMUNITY): Payer: Self-pay | Admitting: Obstetrics and Gynecology

## 2021-12-23 ENCOUNTER — Other Ambulatory Visit: Payer: Self-pay

## 2021-12-23 NOTE — Progress Notes (Addendum)
Brennen Viruet denies chest pain or shortness of breath.  Patient denies having any s/s of Covid in her household.  Patient denies any known exposure to Covid.  ? ?Ms Holton does not have  a PCP at this time. Patient has a history of HTN, she is on HCTZ. I encouraged patient to drink lots of water today. ? ?I instructed  Ms Belfield   to shower with antibacteria soap.   No nail polish, artificial or acrylic nails. Wear clean clothes, brush your teeth. ?Glasses, contact lens,dentures or partials may not be worn in the OR. If you need to wear them, please bring a case for glasses, do not wear contacts or bring a case, the hospital does not have contact cases, dentures or partials will have to be removed , make sure they are clean, we will provide a denture cup to put them in. You will need some one to drive you home and a responsible person over the age of 35 to stay with you for the first 24 hours after surgery.  ?

## 2021-12-24 ENCOUNTER — Ambulatory Visit (HOSPITAL_COMMUNITY)
Admission: RE | Admit: 2021-12-24 | Payer: Medicaid Other | Source: Home / Self Care | Admitting: Obstetrics and Gynecology

## 2021-12-24 DIAGNOSIS — Z3009 Encounter for other general counseling and advice on contraception: Secondary | ICD-10-CM

## 2021-12-24 HISTORY — DX: Essential (primary) hypertension: I10

## 2021-12-24 SURGERY — LIGATION, FALLOPIAN TUBE, LAPAROSCOPIC
Anesthesia: Choice | Laterality: Bilateral

## 2022-01-16 ENCOUNTER — Ambulatory Visit: Payer: Medicaid Other | Admitting: Obstetrics and Gynecology

## 2022-02-24 ENCOUNTER — Ambulatory Visit: Payer: Medicaid Other | Admitting: Obstetrics and Gynecology

## 2022-04-06 ENCOUNTER — Emergency Department (HOSPITAL_COMMUNITY)
Admission: EM | Admit: 2022-04-06 | Discharge: 2022-04-07 | Discharge status: Against Medical Advice | Payer: Medicaid Other | Attending: Emergency Medicine | Admitting: Emergency Medicine

## 2022-04-06 DIAGNOSIS — Z5321 Procedure and treatment not carried out due to patient leaving prior to being seen by health care provider: Secondary | ICD-10-CM | POA: Diagnosis not present

## 2022-04-06 DIAGNOSIS — S40022A Contusion of left upper arm, initial encounter: Secondary | ICD-10-CM | POA: Insufficient documentation

## 2022-04-06 DIAGNOSIS — R6884 Jaw pain: Secondary | ICD-10-CM | POA: Diagnosis not present

## 2022-04-06 DIAGNOSIS — S40021A Contusion of right upper arm, initial encounter: Secondary | ICD-10-CM | POA: Insufficient documentation
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
## 2022-04-07 NOTE — ED Notes (Signed)
X2 no response and not visible in lobby 

## 2022-04-07 NOTE — ED Notes (Signed)
X1 no response for vitals recheck 

## 2022-04-08 ENCOUNTER — Emergency Department (HOSPITAL_COMMUNITY)
Admission: EM | Admit: 2022-04-08 | Discharge: 2022-04-08 | Disposition: A | Discharge status: Home / Self Care | Payer: Medicaid Other | Attending: Emergency Medicine | Admitting: Emergency Medicine

## 2022-04-08 ENCOUNTER — Encounter (HOSPITAL_COMMUNITY): Payer: Self-pay | Admitting: Emergency Medicine

## 2022-04-08 ENCOUNTER — Emergency Department (HOSPITAL_COMMUNITY): Payer: Medicaid Other

## 2022-04-08 DIAGNOSIS — T7421XA Adult sexual abuse, confirmed, initial encounter: Secondary | ICD-10-CM | POA: Diagnosis present

## 2022-04-08 DIAGNOSIS — R6884 Jaw pain: Secondary | ICD-10-CM | POA: Diagnosis not present

## 2022-04-08 DIAGNOSIS — T7411XA Adult physical abuse, confirmed, initial encounter: Secondary | ICD-10-CM | POA: Insufficient documentation

## 2022-04-08 DIAGNOSIS — N9489 Other specified conditions associated with female genital organs and menstrual cycle: Secondary | ICD-10-CM | POA: Diagnosis not present

## 2022-04-08 DIAGNOSIS — N644 Mastodynia: Secondary | ICD-10-CM | POA: Insufficient documentation

## 2022-04-08 LAB — I-STAT BETA HCG BLOOD, ED (MC, WL, AP ONLY): I-stat hCG, quantitative: <5 m[IU]/mL

## 2022-04-08 MED ORDER — OXYCODONE HCL 5 MG PO TABS: 5.0000 mg | ORAL_TABLET | Freq: Four times a day (QID) | ORAL | 0 refills | Status: DC | PRN

## 2022-04-08 MED ORDER — OXYCODONE HCL 5 MG PO TABS
5.0000 mg | ORAL_TABLET | Freq: Once | ORAL | Status: AC
  Administered 2022-04-08: 5 mg via ORAL
  Filled 2022-04-08: qty 1

## 2022-04-08 MED ORDER — OXYCODONE HCL 5 MG PO TABS: 5.0000 mg | ORAL_TABLET | Freq: Four times a day (QID) | ORAL | 0 refills | Status: AC | PRN
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
## 2022-04-30 ENCOUNTER — Other Ambulatory Visit (HOSPITAL_COMMUNITY)
Admission: RE | Admit: 2022-04-30 | Discharge: 2022-04-30 | Disposition: A | Discharge status: Home / Self Care | Payer: Medicaid Other | Source: Ambulatory Visit | Attending: Obstetrics and Gynecology | Admitting: Obstetrics and Gynecology

## 2022-04-30 ENCOUNTER — Other Ambulatory Visit: Payer: Self-pay | Admitting: Student

## 2022-04-30 ENCOUNTER — Ambulatory Visit (INDEPENDENT_AMBULATORY_CARE_PROVIDER_SITE_OTHER): Payer: Medicaid Other | Admitting: Emergency Medicine

## 2022-04-30 VITALS — BP 128/87 | HR 101 | Ht 60.0 in | Wt 200.1 lb

## 2022-04-30 DIAGNOSIS — N760 Acute vaginitis: Secondary | ICD-10-CM | POA: Insufficient documentation

## 2022-04-30 DIAGNOSIS — B3731 Acute candidiasis of vulva and vagina: Secondary | ICD-10-CM | POA: Diagnosis not present

## 2022-04-30 DIAGNOSIS — N6322 Unspecified lump in the left breast, upper inner quadrant: Secondary | ICD-10-CM

## 2022-04-30 DIAGNOSIS — Z113 Encounter for screening for infections with a predominantly sexual mode of transmission: Secondary | ICD-10-CM | POA: Diagnosis present
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
## 2022-05-01 ENCOUNTER — Other Ambulatory Visit: Payer: Self-pay

## 2022-05-01 DIAGNOSIS — B379 Candidiasis, unspecified: Secondary | ICD-10-CM

## 2022-05-01 DIAGNOSIS — B9689 Other specified bacterial agents as the cause of diseases classified elsewhere: Secondary | ICD-10-CM

## 2022-05-01 LAB — CERVICOVAGINAL ANCILLARY ONLY
Bacterial Vaginitis (gardnerella): POSITIVE — AB
Candida Glabrata: POSITIVE — AB
Candida Vaginitis: NEGATIVE
Chlamydia: NEGATIVE
Comment: NEGATIVE
Comment: NEGATIVE
Comment: NEGATIVE
Comment: NEGATIVE
Comment: NEGATIVE
Comment: NORMAL
Neisseria Gonorrhea: NEGATIVE
Trichomonas: NEGATIVE

## 2022-05-01 LAB — HIV ANTIBODY (ROUTINE TESTING W REFLEX): HIV Screen 4th Generation wRfx: NONREACTIVE

## 2022-05-01 LAB — RPR: RPR Ser Ql: NONREACTIVE

## 2022-05-01 LAB — HEPATITIS C ANTIBODY: Hep C Virus Ab: NONREACTIVE

## 2022-05-01 LAB — HEPATITIS B SURFACE ANTIGEN: Hepatitis B Surface Ag: NEGATIVE

## 2022-05-01 MED ORDER — FLUCONAZOLE 150 MG PO TABS: 150.0000 mg | ORAL_TABLET | Freq: Once | ORAL | 0 refills | Status: AC

## 2022-05-01 MED ORDER — METRONIDAZOLE 500 MG PO TABS: 500.0000 mg | ORAL_TABLET | Freq: Two times a day (BID) | ORAL | 0 refills | Status: DC

## 2022-05-07 ENCOUNTER — Ambulatory Visit: Payer: Medicaid Other | Admitting: Advanced Practice Midwife

## 2022-05-07 NOTE — Progress Notes (Deleted)
GYNECOLOGY CLINIC PROCEDURE NOTE  Jennifer Cohen is a 31 y.o. [id number] here for Nexplanon removal*** Nexplanon insertion.  Last pap smear was on *** and was normal.  No other gynecologic concerns.  Nexplanon Insertion Procedure Patient identified, informed consent performed, consent signed.   Patient does understand that irregular bleeding is a very common side effect of this medication. She was advised to have backup contraception for one week after placement. Pregnancy test in clinic today was negative.  Appropriate time out taken.  Patient's left arm was prepped and draped in the usual sterile fashion.. The ruler used to measure and mark insertion area.  Patient was prepped with alcohol swab and then injected with 3 ml of 1% lidocaine.  She was prepped with betadine, Nexplanon removed from packaging,  Device confirmed in needle, then inserted full length of needle and withdrawn per handbook instructions. Nexplanon was able to palpated in the patient's arm; patient palpated the insert herself. There was minimal blood loss.  Patient insertion site covered with guaze and a pressure bandage to reduce any bruising.  The patient tolerated the procedure well and was given post procedure instructions.    No follow-ups on file.   Sharen Counter, CNM 8:26 AM

## 2022-05-16 ENCOUNTER — Other Ambulatory Visit: Payer: Medicaid Other

## 2022-07-08 ENCOUNTER — Other Ambulatory Visit (HOSPITAL_COMMUNITY)
Admission: RE | Admit: 2022-07-08 | Discharge: 2022-07-08 | Disposition: A | Discharge status: Home / Self Care | Payer: Medicaid Other | Source: Ambulatory Visit | Attending: Obstetrics and Gynecology | Admitting: Obstetrics and Gynecology

## 2022-07-08 ENCOUNTER — Ambulatory Visit (INDEPENDENT_AMBULATORY_CARE_PROVIDER_SITE_OTHER): Payer: Medicaid Other | Admitting: Obstetrics and Gynecology

## 2022-07-08 ENCOUNTER — Encounter: Payer: Self-pay | Admitting: Obstetrics and Gynecology

## 2022-07-08 VITALS — BP 122/85 | HR 114 | Ht 60.0 in | Wt 201.0 lb

## 2022-07-08 DIAGNOSIS — R87810 Cervical high risk human papillomavirus (HPV) DNA test positive: Secondary | ICD-10-CM

## 2022-07-08 DIAGNOSIS — Z30017 Encounter for initial prescription of implantable subdermal contraceptive: Secondary | ICD-10-CM

## 2022-07-08 DIAGNOSIS — R87610 Atypical squamous cells of undetermined significance on cytologic smear of cervix (ASC-US): Secondary | ICD-10-CM | POA: Diagnosis present

## 2022-07-08 LAB — POCT URINE PREGNANCY: Preg Test, Ur: NEGATIVE

## 2022-07-08 MED ORDER — ETONOGESTREL 68 MG ~~LOC~~ IMPL
68.0000 mg | DRUG_IMPLANT | Freq: Once | SUBCUTANEOUS | Status: AC
  Administered 2022-07-08: 68 mg via SUBCUTANEOUS

## 2022-07-08 NOTE — Progress Notes (Addendum)
31 y.o GYN presents for Nexplanon Insertion.  Last unprotected sex was maybe 2 weeks ago.  Last PAP 04/10/2021 ASCUS and +High risk HPV.  UPT today Negative.  Administrations This Visit     etonogestrel (NEXPLANON) implant 68 mg     Admin Date 07/08/2022 Action Given Dose 68 mg Route Subdermal Administered By Tamela Oddi, RMA

## 2022-07-08 NOTE — Progress Notes (Signed)
     [facility] PROCEDURE NOTE  Jennifer Cohen is a 31 y.o. [id number] here for Nexplanon insertion.  Last pap smear was on 8/22 and was abnormal.  No other gynecologic concerns.  Nexplanon Insertion Procedure Patient identified, informed consent performed, consent signed.   Patient does understand that irregular bleeding is a very common side effect of this medication. She was advised to have backup contraception for one week after placement. Pregnancy test in clinic today was negative.  Appropriate time out taken.  Patient's left arm was prepped and draped in the usual sterile fashion.. The ruler used to measure and mark insertion area.  Patient was prepped with alcohol swab and then injected with 3 ml of 1% lidocaine.  She was prepped with betadine, Nexplanon removed from packaging,  Device confirmed in needle, then inserted full length of needle and withdrawn per handbook instructions. Nexplanon was able to palpated in the patient's arm; patient palpated the insert herself. There was minimal blood loss.  Patient insertion site covered with guaze and a pressure bandage to reduce any bruising.  The patient tolerated the procedure well and was given post procedure instructions.     Pap smear obtained today as well   Arlina Robes, MD, [place] Attending [place] for [facility], [place]

## 2022-07-08 NOTE — Patient Instructions (Signed)
Etonogestrel Implant What is this medication? ETONOGESTREL (et oh noe JES trel) prevents ovulation and pregnancy. It belongs to a group of medications called contraceptives. This medication is a progestin hormone. This medicine may be used for other purposes; ask your health care provider or pharmacist if you have questions. COMMON BRAND NAME(S): Implanon, Nexplanon What should I tell my care team before I take this medication? They need to know if you have any of these conditions: Abnormal vaginal bleeding Blood clots Blood vessel disease Breast, cervical, endometrial, ovarian, liver, or uterine cancer Diabetes Gallbladder disease Heart disease or recent heart attack High blood pressure High cholesterol or triglycerides Kidney disease Liver disease Migraine headaches Seizures Stroke Tobacco use An unusual or allergic reaction to etonogestrel, other medications, foods, dyes, or preservatives Pregnant or trying to get pregnant Breastfeeding How should I use this medication? This device is inserted just under the skin on the inner side of your upper arm by your care team. Talk to your care team about the use of this medication in children. Special care may be needed. Overdosage: If you think you have taken too much of this medicine contact a poison control center or emergency room at once. NOTE: This medicine is only for you. Do not share this medicine with others. What if I miss a dose? This does not apply. What may interact with this medication? Do not take this medication with any of the following: Amprenavir Fosamprenavir This medication may also interact with the following: Acitretin Aprepitant Armodafinil Bexarotene Bosentan Carbamazepine Certain antivirals for HIV or hepatitis Certain medications for fungal infections, such as fluconazole, ketoconazole, itraconazole, or  voriconazole Cyclosporine Felbamate Griseofulvin Lamotrigine Modafinil Oxcarbazepine Phenobarbital Phenytoin Primidone Rifabutin Rifampin Rifapentine St. John's wort Topiramate This list may not describe all possible interactions. Give your health care provider a list of all the medicines, herbs, non-prescription drugs, or dietary supplements you use. Also tell them if you smoke, drink alcohol, or use illegal drugs. Some items may interact with your medicine. What should I watch for while using this medication? Visit your care team for regular checks on your progress. Using this medication does not protect you or your partner against HIV or other sexually transmitted infections (STIs). You should be able to feel the implant by pressing your fingertips over the skin where it was inserted. Contact your care team if you cannot feel the implant, and use a non-hormonal birth control method (such as condoms) until your care team confirms that the implant is in place. Contact your care team if you think that the implant may have broken or become bent while in your arm. You will receive a user card from your care team after the implant is inserted. The card is a record of the location of the implant in your upper arm and when it should be removed. Keep this card with your health records. What side effects may I notice from receiving this medication? Side effects that you should report to your care team as soon as possible: Allergic reactions--skin rash, itching, hives, swelling of the face, lips, tongue, or throat Blood clot--pain, swelling, or warmth in the leg, shortness of breath, chest pain Gallbladder problems--severe stomach pain, nausea, vomiting, fever Increase in blood pressure Liver injury--right upper belly pain, loss of appetite, nausea, light-colored stool, dark yellow or brown urine, yellowing skin or eyes, unusual weakness or fatigue New or worsening migraines or headaches Pain,  redness, or irritation at injection site Stroke--sudden numbness or weakness of the face, arm,  or leg, trouble speaking, confusion, trouble walking, loss of balance or coordination, dizziness, severe headache, change in vision Unusual vaginal discharge, itching, or odor Worsening mood, feelings of depression Side effects that usually do not require medical attention (report to your care team if they continue or are bothersome): Breast pain or tenderness Dark patches of skin on the face or other sun-exposed areas Irregular menstrual cycles or spotting Nausea Weight gain This list may not describe all possible side effects. Call your doctor for medical advice about side effects. You may report side effects to FDA at 1-800-FDA-1088. Where should I keep my medication? This medication is given in a hospital or clinic and will not be stored at home. NOTE: This sheet is a summary. It may not cover all possible information. If you have questions about this medicine, talk to your doctor, pharmacist, or health care provider.  2023 Elsevier/Gold Standard (2022-03-25 00:00:00)

## 2022-07-11 LAB — CYTOLOGY - PAP
Comment: NEGATIVE
Comment: NEGATIVE
Comment: NEGATIVE
Diagnosis: HIGH — AB
HPV 16: POSITIVE — AB
HPV 18 / 45: NEGATIVE
High risk HPV: POSITIVE — AB

## 2022-07-14 ENCOUNTER — Encounter: Payer: Self-pay | Admitting: Obstetrics and Gynecology

## 2022-07-14 ENCOUNTER — Telehealth: Payer: Self-pay

## 2022-07-14 NOTE — Telephone Encounter (Signed)
spoke with Pt and notified her of abnormal PAP results and the need for a COLPO. Front Office will schedule pt for COLPO. Pt verbalized understanding and agreement.

## 2022-07-14 NOTE — Telephone Encounter (Signed)
-----   Message from Hermina Staggers, MD sent at 07/14/2022  9:19 AM EST ----- Please schedule pt for a colpo Thanks  Casimiro Needle

## 2022-09-02 ENCOUNTER — Other Ambulatory Visit (HOSPITAL_COMMUNITY)
Admission: RE | Admit: 2022-09-02 | Discharge: 2022-09-02 | Disposition: A | Discharge status: Home / Self Care | Payer: Medicaid Other | Source: Ambulatory Visit | Attending: Obstetrics & Gynecology | Admitting: Obstetrics & Gynecology

## 2022-09-02 ENCOUNTER — Ambulatory Visit (INDEPENDENT_AMBULATORY_CARE_PROVIDER_SITE_OTHER): Payer: Medicaid Other | Admitting: Obstetrics & Gynecology

## 2022-09-02 VITALS — BP 147/99 | HR 91 | Wt 203.0 lb

## 2022-09-02 DIAGNOSIS — R87610 Atypical squamous cells of undetermined significance on cytologic smear of cervix (ASC-US): Secondary | ICD-10-CM | POA: Insufficient documentation

## 2022-09-02 DIAGNOSIS — Z30017 Encounter for initial prescription of implantable subdermal contraceptive: Secondary | ICD-10-CM

## 2022-09-02 DIAGNOSIS — R87810 Cervical high risk human papillomavirus (HPV) DNA test positive: Secondary | ICD-10-CM | POA: Insufficient documentation

## 2022-09-02 DIAGNOSIS — D069 Carcinoma in situ of cervix, unspecified: Secondary | ICD-10-CM

## 2022-09-02 LAB — POCT URINE PREGNANCY: Preg Test, Ur: NEGATIVE
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
## 2022-09-03 LAB — CERVICOVAGINAL ANCILLARY ONLY
Chlamydia: NEGATIVE
Comment: NEGATIVE
Comment: NORMAL
Neisseria Gonorrhea: NEGATIVE

## 2022-09-03 LAB — RPR: RPR Ser Ql: NONREACTIVE

## 2022-09-03 LAB — HEPATITIS B SURFACE ANTIGEN: Hepatitis B Surface Ag: NEGATIVE

## 2022-09-03 LAB — HEPATITIS C ANTIBODY: Hep C Virus Ab: NONREACTIVE

## 2022-09-03 LAB — HIV ANTIBODY (ROUTINE TESTING W REFLEX): HIV Screen 4th Generation wRfx: NONREACTIVE

## 2022-09-04 DIAGNOSIS — D069 Carcinoma in situ of cervix, unspecified: Secondary | ICD-10-CM | POA: Insufficient documentation

## 2022-09-04 LAB — SURGICAL PATHOLOGY

## 2022-09-04 NOTE — Progress Notes (Signed)
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

## 2022-09-05 ENCOUNTER — Telehealth: Payer: Self-pay | Admitting: Emergency Medicine

## 2022-09-05 NOTE — Telephone Encounter (Signed)
Attempted TC to patient to discuss results and LEEP procedure. LVM to return call to office.

## 2022-09-05 NOTE — Telephone Encounter (Signed)
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

## 2022-09-29 ENCOUNTER — Encounter: Payer: Medicaid Other | Admitting: Obstetrics & Gynecology

## 2022-11-05 ENCOUNTER — Encounter: Payer: Medicaid Other | Admitting: Obstetrics and Gynecology

## 2022-12-24 ENCOUNTER — Encounter: Payer: Medicaid Other | Admitting: Obstetrics and Gynecology

## 2022-12-29 ENCOUNTER — Encounter (HOSPITAL_COMMUNITY): Payer: Self-pay

## 2022-12-29 ENCOUNTER — Emergency Department (HOSPITAL_COMMUNITY): Payer: Medicaid Other

## 2022-12-29 ENCOUNTER — Emergency Department (HOSPITAL_COMMUNITY)
Admission: EM | Admit: 2022-12-29 | Discharge: 2022-12-29 | Disposition: A | Discharge status: Home / Self Care | Payer: Medicaid Other | Attending: Student | Admitting: Student

## 2022-12-29 DIAGNOSIS — K921 Melena: Secondary | ICD-10-CM | POA: Insufficient documentation

## 2022-12-29 DIAGNOSIS — R1033 Periumbilical pain: Secondary | ICD-10-CM | POA: Diagnosis present

## 2022-12-29 LAB — COMPREHENSIVE METABOLIC PANEL
ALT: 18 U/L (ref 0–44)
AST: 16 U/L (ref 15–41)
Albumin: 3.7 g/dL (ref 3.5–5.0)
Alkaline Phosphatase: 64 U/L (ref 38–126)
Anion gap: 9 (ref 5–15)
BUN: 10 mg/dL (ref 6–20)
CO2: 24 mmol/L (ref 22–32)
Calcium: 8.9 mg/dL (ref 8.9–10.3)
Chloride: 106 mmol/L (ref 98–111)
Creatinine, Ser: 0.65 mg/dL (ref 0.44–1.00)
GFR, Estimated: >60 mL/min (ref >60)
Glucose, Bld: 94 mg/dL (ref 70–99)
Potassium: 3.3 mmol/L — ABNORMAL LOW (ref 3.5–5.1)
Sodium: 139 mmol/L (ref 135–145)
Total Bilirubin: 0.3 mg/dL (ref 0.3–1.2)
Total Protein: 7.5 g/dL (ref 6.5–8.1)

## 2022-12-29 LAB — CBC WITH DIFFERENTIAL/PLATELET
Abs Immature Granulocytes: 0.09 10*3/uL — ABNORMAL HIGH (ref 0.00–0.07)
Basophils Absolute: 0.1 10*3/uL (ref 0.0–0.1)
Basophils Relative: 1 %
Eosinophils Absolute: 0.7 10*3/uL — ABNORMAL HIGH (ref 0.0–0.5)
Eosinophils Relative: 5 %
HCT: 43.1 % (ref 36.0–46.0)
Hemoglobin: 14.4 g/dL (ref 12.0–15.0)
Immature Granulocytes: 1 %
Lymphocytes Relative: 23 %
Lymphs Abs: 3 10*3/uL (ref 0.7–4.0)
MCH: 25.6 pg — ABNORMAL LOW (ref 26.0–34.0)
MCHC: 33.4 g/dL (ref 30.0–36.0)
MCV: 76.6 fL — ABNORMAL LOW (ref 80.0–100.0)
Monocytes Absolute: 0.6 10*3/uL (ref 0.1–1.0)
Monocytes Relative: 5 %
Neutro Abs: 8.6 10*3/uL — ABNORMAL HIGH (ref 1.7–7.7)
Neutrophils Relative %: 65 %
Platelets: 433 10*3/uL — ABNORMAL HIGH (ref 150–400)
RBC: 5.63 MIL/uL — ABNORMAL HIGH (ref 3.87–5.11)
RDW: 14.6 % (ref 11.5–15.5)
WBC: 13.1 10*3/uL — ABNORMAL HIGH (ref 4.0–10.5)
nRBC: 0 % (ref 0.0–0.2)

## 2022-12-29 LAB — TYPE AND SCREEN
ABO/RH(D): A POS
Antibody Screen: NEGATIVE

## 2022-12-29 LAB — POC OCCULT BLOOD, ED: Fecal Occult Bld: NEGATIVE

## 2022-12-29 LAB — I-STAT BETA HCG BLOOD, ED (MC, WL, AP ONLY): I-stat hCG, quantitative: <5 m[IU]/mL (ref <5)

## 2022-12-29 MED ORDER — IOHEXOL 350 MG/ML SOLN
75.0000 mL | Freq: Once | INTRAVENOUS | Status: AC | PRN
  Administered 2022-12-29: 75 mL via INTRAVENOUS

## 2022-12-29 NOTE — ED Provider Triage Note (Signed)
  Emergency Medicine Provider Triage Evaluation Note  MRN:  [id number]  Arrival date & time: 12/29/22    Medically screening exam initiated at 1:56 AM.   CC:   Rectal Bleeding   HPI:  Rhodie Cienfuegos is a 32 y.o. year-old female presents to the ED with chief complaint of BRBPR.  Onset today.  Reports associated abdominal pain.  History provided by patient. ROS:  -As included in HPI PE:   Vitals:   12/29/22 0016 12/29/22 0145  BP: (!) 152/115   Pulse: (!) 108   Resp: 18   Temp: 99.7 F (37.6 C)   SpO2: 98% 98%    Non-toxic appearing No respiratory distress  MDM:  Based on signs and symptoms, GI bleed is highest on my differential. I've ordered labs and imaging in triage to expedite lab/diagnostic workup.  Patient was informed that the remainder of the evaluation will be completed by another provider, this initial triage assessment does not replace that evaluation, and the importance of remaining in the ED until their evaluation is complete.    Roxy Horseman, PA-C 12/29/22 0157

## 2022-12-29 NOTE — ED Provider Notes (Signed)
[facility] AT [facility] Provider Note   CSN: [id number] Arrival date & time: 12/29/22  0010     History  Chief Complaint  Patient presents with   Rectal Bleeding    Jennifer Cohen is a 32 y.o. female.  Patient with no past surgical history presents to the emergency department today for evaluation of abdominal pain and blood noted in her stool x 2.  Patient states that this first occurred late yesterday afternoon.  She then took a nap and then had another bowel movement and noted some bright red blood on the toilet paper.  Overall is a small amount.  She has had associated abdominal pain that is worse around the umbilicus.  No associated nausea, vomiting, diarrhea.  She states that she has had some intermittent bleeding before "once in a blue moon".  She is certain it was not coming from her vagina.  She is due to have a LEEP procedure in the near future.  No fevers.       Home Medications Prior to Admission medications   Medication Sig Start Date End Date Taking? Authorizing Provider  hydrochlorothiazide (HYDRODIURIL) 25 MG tablet Take 1 tablet (25 mg total) by mouth daily. 10/30/21   Hermina Staggers, MD      Allergies    Patient has no known allergies.    Review of Systems   Review of Systems  Physical Exam Updated Vital Signs BP (!) 164/102 (BP Location: Right Arm)   Pulse 96   Temp 98.6 F (37 C)   Resp 18   SpO2 97%  Physical Exam Vitals and nursing note reviewed. Exam conducted with a chaperone present.  Constitutional:      General: She is not in acute distress.    Appearance: She is well-developed.  HENT:     Head: Normocephalic and atraumatic.     Right Ear: External ear normal.     Left Ear: External ear normal.     Nose: Nose normal.  Eyes:     Conjunctiva/sclera: Conjunctivae normal.  Cardiovascular:     Rate and Rhythm: Normal rate and regular rhythm.     Heart sounds: No murmur heard. Pulmonary:     Effort: No  respiratory distress.     Breath sounds: No wheezing, rhonchi or rales.  Abdominal:     Palpations: Abdomen is soft.     Tenderness: There is abdominal tenderness. There is no guarding or rebound.     Comments: Mild periumbilical tenderness to palpation  Genitourinary:    Rectum: Guaiac result negative. External hemorrhoid present. No tenderness or internal hemorrhoid.     Comments: Small external skin tag noted around the rectum, no active bleeding, no fissure. Musculoskeletal:     Cervical back: Normal range of motion and neck supple.     Right lower leg: No edema.     Left lower leg: No edema.  Skin:    General: Skin is warm and dry.     Findings: No rash.  Neurological:     General: No focal deficit present.     Mental Status: She is alert. Mental status is at baseline.     Motor: No weakness.  Psychiatric:        Mood and Affect: Mood normal.     ED Results / Procedures / Treatments   Labs (all labs ordered are listed, but only abnormal results are displayed) Labs Reviewed  COMPREHENSIVE METABOLIC PANEL - Abnormal; Notable for the following  components:      Result Value   Potassium 3.3 (*)    All other components within normal limits  CBC WITH DIFFERENTIAL/PLATELET - Abnormal; Notable for the following components:   WBC 13.1 (*)    RBC 5.63 (*)    MCV 76.6 (*)    MCH 25.6 (*)    Platelets 433 (*)    Neutro Abs 8.6 (*)    Eosinophils Absolute 0.7 (*)    Abs Immature Granulocytes 0.09 (*)    All other components within normal limits  I-STAT BETA HCG BLOOD, ED ([facility], [facility], [facility] ONLY)  POC OCCULT BLOOD, ED  TYPE AND SCREEN    EKG None  Radiology CT ABDOMEN PELVIS W CONTRAST  Result Date: 12/29/2022 CLINICAL DATA:  Abdominal pain, acute, nonlocalized. Blood in stool. Right upper quadrant pain EXAM: CT ABDOMEN AND PELVIS WITH CONTRAST TECHNIQUE: Multidetector CT imaging of the abdomen and pelvis was performed using the standard protocol following bolus administration of  intravenous contrast. RADIATION DOSE REDUCTION: This exam was performed according to the departmental dose-optimization program which includes automated exposure control, adjustment of the mA and/or kV according to patient size and/or use of iterative reconstruction technique. CONTRAST:  75mL OMNIPAQUE IOHEXOL 350 MG/ML SOLN COMPARISON:  None Available. FINDINGS: Lower chest: Normal Hepatobiliary: Liver parenchyma is normal.  No calcified gallstones. Pancreas: Normal Spleen: Normal Adrenals/Urinary Tract: Adrenal glands are normal. Kidneys are normal. No stone, mass or hydronephrosis. Bladder is normal. Stomach/Bowel: Stomach and small intestine are normal. Normal appendix. No colon abnormality visible by CT. Vascular/Lymphatic: Aorta and IVC are normal.  No adenopathy. Reproductive: Normal. Retroverted uterus incidentally noted. No mass or visible inflammatory disease. Other: No free fluid or air. Musculoskeletal: L5-S1 degenerative disc disease. IMPRESSION: 1. No abnormality seen to explain the clinical presentation. 2. L5-S1 degenerative disc disease. Electronically Signed   By: Paulina Fusi M.D.   On: 12/29/2022 07:47    Procedures Procedures    Medications Ordered in ED Medications - No data to display  ED Course/ Medical Decision Making/ A&P    Patient seen and examined. History obtained directly from patient. Work-up including labs, imaging, EKG ordered in triage, if performed, were reviewed.    Labs/EKG: Independently reviewed and interpreted.  This included: CBC, minimally elevated but this appears to be chronic, elevated neutrophils and eosinophils noted; CMP mild hypokalemia otherwise unremarkable; pregnancy negative.  Imaging: CT abdomen pelvis ordered in triage due to rectal bleeding and abdominal pain.  Medications/Fluids: Ordered: None ordered   Most recent vital signs reviewed and are as follows: BP (!) 164/102 (BP Location: Right Arm)   Pulse 96   Temp 98.6 F (37 C)   Resp  18   SpO2 97%   Initial impression: Abdominal pain periumbilical and rectal bleeding.  No obvious source from rectum.  8:09 AM Reassessment performed. Patient appears stable.  She was resting in her room when I entered.  Labs personally reviewed and interpreted including: Hemoccult was negative  Imaging personally visualized and interpreted including: CT abdomen pelvis agree negative  Reviewed pertinent lab work and imaging with patient at bedside. Questions answered.   Most current vital signs reviewed and are as follows: BP (!) 164/102 (BP Location: Right Arm)   Pulse 96   Temp 98.6 F (37 C)   Resp 18   Ht 5' (1.524 m)   Wt 92.1 kg   SpO2 97%   BMI 39.65 kg/m   Plan: Discharge to home.   Prescriptions written for: None  Other home care instructions discussed: Good fiber intake, maintain good hydration  ED return instructions discussed: The patient was urged to return to the Emergency Department immediately with worsening of current symptoms, worsening abdominal pain, persistent vomiting, heavier amounts of blood noted in stools, fever, or any other concerns. The patient verbalized understanding.   Follow-up instructions discussed: Patient encouraged to follow-up with their PCP in 3 days.  GI referral given, encouraged in the next 2 weeks if symptoms are persistent.                            Medical Decision Making  For this patient's complaint of abdominal pain, the following conditions were considered on the differential diagnosis: gastritis/PUD, enteritis/duodenitis, appendicitis, cholelithiasis/cholecystitis, cholangitis, pancreatitis, ruptured viscus, colitis, diverticulitis, small/large bowel obstruction, proctitis, cystitis, pyelonephritis, ureteral colic, aortic dissection, aortic aneurysm. In women, ectopic pregnancy, pelvic inflammatory disease, ovarian cysts, and tubo-ovarian abscess were also considered. Atypical chest etiologies were also considered including  ACS, PE, and pneumonia.  Lower GI bleeding suspected given passage of heme positive stool, bright red blood per rectum, hematochezia (maroon stool), clots noted in stool, red blood on toilet paper or in toilet bowl, no melanotic stools, presentation not consistent with a large upper GI bleed.  The following differential diagnoses were considered for this patient's lower GI bleed: *Hemorrhoids - can be painless, blood noted on toilet paper *Anal fissure - tearing pain with defecation, small amount of blood *Colonic polyp - usually painless *Proctitis - usually associated with passage of mucus and diarrhea *IBD - presents with crampy abdominal pain, tenesmus, fever *Infectious diarrhea - usually abrupt onset* *Diverticulosis - large volume of painless bleeding, >40yo *Mesenteric ischemia - >50yo, underlying cardiovascular disease, pain out of proportion *Colon cancer *Arteriovenous malformation  None of the following red flags identified or suspected: *Abnormal vital signs *Symptoms suggestive of malignancy such as constitutional symptoms (fever, weight loss), anemia, or change in frequency, caliber or consistency of stools * Family history of colon cancer  The patient appears reasonably screened and/or stabilized for discharge and I doubt any other medical condition or other emergency medical conditions requiring further screening, evaluation, or treatment in the ED at this time prior to discharge.  Follow-up: Age less than 40, source of bleeding not identified -- unlikely to be colon cancer or other serious etiology. GI referral given for further evaluation, possible sigmoidoscopy.          Final Clinical Impression(s) / ED Diagnoses Final diagnoses:  Periumbilical abdominal pain  Hematochezia    Rx / DC Orders ED Discharge Orders     None         Renne Crigler, PA-C 12/29/22 0810    Glendora Score, MD 12/29/22 2028

## 2022-12-29 NOTE — ED Notes (Signed)
Patient transported to CT 

## 2022-12-29 NOTE — Discharge Instructions (Addendum)
Please read and follow all provided instructions.  Your diagnoses today include:  1. Periumbilical abdominal pain   2. Hematochezia     Tests performed today include: Blood cell counts and platelets: Your infection fighting cell count was elevated but at your baseline, no signs of significant blood loss Kidney and liver function tests Pancreas function test (called lipase) A blood or urine test for pregnancy (women only): Was negative CT of your abdomen and pelvis: Did not show any signs of infection or cause of bleeding Vital signs. See below for your results today.   Medications prescribed:  None  Take any prescribed medications only as directed.  Home care instructions:  Follow any educational materials contained in this packet.  Follow-up instructions: Please follow-up with your primary care provider in the next 3 days for further evaluation of your symptoms.    Please follow-up with the gastroenterologist in 2 weeks if your symptoms are persistent.  Return instructions:  SEEK IMMEDIATE MEDICAL ATTENTION IF: The pain does not go away or becomes severe  A temperature above 101F develops  Repeated vomiting occurs (multiple episodes)  The pain becomes localized to portions of the abdomen. The right side could possibly be appendicitis. In an adult, the left lower portion of the abdomen could be colitis or diverticulitis.  Heavier amounts of blood is being passed in stools or vomit (bright red or black tarry stools)  You develop chest pain, difficulty breathing, dizziness or fainting, or become confused, poorly responsive, or inconsolable (young children) If you have any other emergent concerns regarding your health  Additional Information: Abdominal (belly) pain can be caused by many things. Your caregiver performed an examination and possibly ordered blood/urine tests and imaging (CT scan, x-rays, ultrasound). Many cases can be observed and treated at home after initial  evaluation in the emergency department. Even though you are being discharged home, abdominal pain can be unpredictable. Therefore, you need a repeated exam if your pain does not resolve, returns, or worsens. Most patients with abdominal pain don't have to be admitted to the hospital or have surgery, but serious problems like appendicitis and gallbladder attacks can start out as nonspecific pain. Many abdominal conditions cannot be diagnosed in one visit, so follow-up evaluations are very important.  Your vital signs today were: BP (!) 164/102 (BP Location: Right Arm)   Pulse 96   Temp 98.6 F (37 C)   Resp 18   Ht 5' (1.524 m)   Wt 92.1 kg   SpO2 97%   BMI 39.65 kg/m  If your blood pressure (bp) was elevated above 135/85 this visit, please have this repeated by your doctor within one month. --------------

## 2022-12-29 NOTE — ED Triage Notes (Signed)
Pt arrived from home via POV c/o small amount of red blood on stool and on toilet paper this evening.

## 2023-02-10 ENCOUNTER — Telehealth: Payer: Self-pay | Admitting: Obstetrics & Gynecology

## 2023-02-10 ENCOUNTER — Encounter: Payer: Medicaid Other | Admitting: Obstetrics & Gynecology

## 2023-03-24 ENCOUNTER — Other Ambulatory Visit (HOSPITAL_COMMUNITY)
Admission: RE | Admit: 2023-03-24 | Discharge: 2023-03-24 | Disposition: A | Discharge status: Home / Self Care | Payer: 59 | Source: Ambulatory Visit | Attending: Obstetrics and Gynecology | Admitting: Obstetrics and Gynecology

## 2023-03-24 ENCOUNTER — Ambulatory Visit (INDEPENDENT_AMBULATORY_CARE_PROVIDER_SITE_OTHER): Payer: 59 | Admitting: *Deleted

## 2023-03-24 VITALS — BP 139/93 | HR 91

## 2023-03-24 DIAGNOSIS — Z758 Other problems related to medical facilities and other health care: Secondary | ICD-10-CM

## 2023-03-24 DIAGNOSIS — Z113 Encounter for screening for infections with a predominantly sexual mode of transmission: Secondary | ICD-10-CM | POA: Insufficient documentation

## 2023-03-24 NOTE — Patient Instructions (Signed)
Hypertension, Adult Hypertension is another name for high blood pressure. High blood pressure forces your heart to work harder to pump blood. This can cause problems over time. There are two numbers in a blood pressure reading. There is a top number (systolic) over a bottom number (diastolic). It is best to have a blood pressure that is below 120/80. What are the causes? The cause of this condition is not known. Some other conditions can lead to high blood pressure. What increases the risk? Some lifestyle factors can make you more likely to develop high blood pressure: Smoking. Not getting enough exercise or physical activity. Being overweight. Having too much fat, sugar, calories, or salt (sodium) in your diet. Drinking too much alcohol. Other risk factors include: Having any of these conditions: Heart disease. Diabetes. High cholesterol. Kidney disease. Obstructive sleep apnea. Having a family history of high blood pressure and high cholesterol. Age. The risk increases with age. Stress. What are the signs or symptoms? High blood pressure may not cause symptoms. Very high blood pressure (hypertensive crisis) may cause: Headache. Fast or uneven heartbeats (palpitations). Shortness of breath. Nosebleed. Vomiting or feeling like you may vomit (nauseous). Changes in how you see. Very bad chest pain. Feeling dizzy. Seizures. How is this treated? This condition is treated by making healthy lifestyle changes, such as: Eating healthy foods. Exercising more. Drinking less alcohol. Your doctor may prescribe medicine if lifestyle changes do not help enough and if: Your top number is above 130. Your bottom number is above 80. Your personal target blood pressure may vary. Follow these instructions at home: Eating and drinking  If told, follow the DASH eating plan. To follow this plan: Fill one half of your plate at each meal with fruits and vegetables. Fill one fourth of your plate  at each meal with whole grains. Whole grains include whole-wheat pasta, brown rice, and whole-grain bread. Eat or drink low-fat dairy products, such as skim milk or low-fat yogurt. Fill one fourth of your plate at each meal with low-fat (lean) proteins. Low-fat proteins include fish, chicken without skin, eggs, beans, and tofu. Avoid fatty meat, cured and processed meat, or chicken with skin. Avoid pre-made or processed food. Limit the amount of salt in your diet to less than 1,500 mg each day. Do not drink alcohol if: Your doctor tells you not to drink. You are pregnant, may be pregnant, or are planning to become pregnant. If you drink alcohol: Limit how much you have to: 0-1 drink a day for women. 0-2 drinks a day for men. Know how much alcohol is in your drink. In the [place] one drink equals one 12 oz bottle of beer (355 mL), one 5 oz glass of wine (148 mL), or one 1 oz glass of hard liquor (44 mL). Lifestyle  Work with your doctor to stay at a healthy weight or to lose weight. Ask your doctor what the best weight is for you. Get at least 30 minutes of exercise that causes your heart to beat faster (aerobic exercise) most days of the week. This may include walking, swimming, or biking. Get at least 30 minutes of exercise that strengthens your muscles (resistance exercise) at least 3 days a week. This may include lifting weights or doing Pilates. Do not smoke or use any products that contain nicotine or tobacco. If you need help quitting, ask your doctor. Check your blood pressure at home as told by your doctor. Keep all follow-up visits. Medicines Take over-the-counter and prescription medicines   only as told by your doctor. Follow directions carefully. Do not skip doses of blood pressure medicine. The medicine does not work as well if you skip doses. Skipping doses also puts you at risk for problems. Ask your doctor about side effects or reactions to medicines that you should watch  for. Contact a doctor if: You think you are having a reaction to the medicine you are taking. You have headaches that keep coming back. You feel dizzy. You have swelling in your ankles. You have trouble with your vision. Get help right away if: You get a very bad headache. You start to feel mixed up (confused). You feel weak or numb. You feel faint. You have very bad pain in your: Chest. Belly (abdomen). You vomit more than once. You have trouble breathing. These symptoms may be an emergency. Get help right away. Call 911. Do not wait to see if the symptoms will go away. Do not drive yourself to the hospital. Summary Hypertension is another name for high blood pressure. High blood pressure forces your heart to work harder to pump blood. For most people, a normal blood pressure is less than 120/80. Making healthy choices can help lower blood pressure. If your blood pressure does not get lower with healthy choices, you may need to take medicine. This information is not intended to replace advice given to you by your health care provider. Make sure you discuss any questions you have with your health care provider. Document Revised: 06/06/2021 Document Reviewed: 06/06/2021 Elsevier Patient Education  2024 [employer].  

## 2023-03-24 NOTE — Progress Notes (Signed)
SUBJECTIVE:  32 y.o. female who desires a STI screen. Denies abnormal vaginal discharge, bleeding or significant pelvic pain. No UTI symptoms. Denies history of known exposure to STD.  No LMP recorded.  OBJECTIVE:  She appears well.   ASSESSMENT:  STI Screen   PLAN:  Pt offered STI blood screening-requested GC, chlamydia, and trichomonas probe sent to lab.  Treatment: To be determined once lab results are received.  Pt follow up as needed.  BP elevated at today's visit. Improved upon recheck. Pt advised to seek PCP for HTN management outside of pregnancy. Referral to [facility] placed. Pt strongly encouraged to follow up. Long term consequences of untreated HTN reviewed.

## 2023-03-25 LAB — HEPATITIS B SURFACE ANTIGEN: Hepatitis B Surface Ag: NEGATIVE

## 2023-03-25 LAB — RPR: RPR Ser Ql: NONREACTIVE

## 2023-03-25 LAB — HIV ANTIBODY (ROUTINE TESTING W REFLEX): HIV Screen 4th Generation wRfx: NONREACTIVE

## 2023-03-25 LAB — HEPATITIS C ANTIBODY: Hep C Virus Ab: NONREACTIVE

## 2023-03-26 LAB — CERVICOVAGINAL ANCILLARY ONLY
Bacterial Vaginitis (gardnerella): POSITIVE — AB
Candida Glabrata: NEGATIVE
Candida Vaginitis: NEGATIVE
Chlamydia: NEGATIVE
Comment: NEGATIVE
Comment: NEGATIVE
Comment: NEGATIVE
Comment: NEGATIVE
Comment: NEGATIVE
Comment: NORMAL
Neisseria Gonorrhea: NEGATIVE
Trichomonas: NEGATIVE

## 2023-03-26 MED ORDER — METRONIDAZOLE 500 MG PO TABS
500.0000 mg | ORAL_TABLET | Freq: Two times a day (BID) | ORAL | 0 refills | Status: DC
Start: 2023-03-26 — End: 2024-01-26

## 2023-03-26 NOTE — Addendum Note (Signed)
Addended by: Catalina Antigua on: 03/26/2023 01:31 PM   Modules accepted: Orders

## 2023-08-28 ENCOUNTER — Encounter (HOSPITAL_COMMUNITY): Payer: Self-pay

## 2023-08-28 ENCOUNTER — Emergency Department (HOSPITAL_COMMUNITY)
Admission: EM | Admit: 2023-08-28 | Discharge: 2023-08-28 | Discharge status: Against Medical Advice | Payer: 59 | Attending: Emergency Medicine | Admitting: Emergency Medicine

## 2023-08-28 ENCOUNTER — Other Ambulatory Visit: Payer: Self-pay

## 2023-08-28 DIAGNOSIS — M545 Low back pain, unspecified: Secondary | ICD-10-CM | POA: Insufficient documentation

## 2023-08-28 DIAGNOSIS — Z5321 Procedure and treatment not carried out due to patient leaving prior to being seen by health care provider: Secondary | ICD-10-CM | POA: Insufficient documentation

## 2023-08-28 DIAGNOSIS — M5432 Sciatica, left side: Secondary | ICD-10-CM | POA: Diagnosis not present

## 2023-08-28 LAB — URINALYSIS, ROUTINE W REFLEX MICROSCOPIC
Bilirubin Urine: NEGATIVE
Glucose, UA: NEGATIVE mg/dL
Ketones, ur: NEGATIVE mg/dL
Nitrite: NEGATIVE
Protein, ur: 100 mg/dL — AB
Specific Gravity, Urine: 1.017 (ref 1.005–1.030)
pH: 5 (ref 5.0–8.0)

## 2023-08-28 LAB — PREGNANCY, URINE: Preg Test, Ur: NEGATIVE

## 2023-08-28 NOTE — ED Triage Notes (Addendum)
Pt arrived POV from home d/t left lower back pain near left hip and radiates down left leg.  Onset Saturday but at 0430 "can barely bend down".   Denies dysuria.

## 2023-08-28 NOTE — ED Notes (Signed)
Pt left the lobby at 0908, stated that she is going to go to UC.

## 2023-08-30 ENCOUNTER — Encounter (HOSPITAL_COMMUNITY): Payer: Self-pay | Admitting: Emergency Medicine

## 2023-08-30 ENCOUNTER — Emergency Department (HOSPITAL_COMMUNITY)
Admission: EM | Admit: 2023-08-30 | Discharge: 2023-08-31 | Discharge status: Against Medical Advice | Payer: 59 | Attending: Emergency Medicine | Admitting: Emergency Medicine

## 2023-08-30 ENCOUNTER — Other Ambulatory Visit: Payer: Self-pay

## 2023-08-30 DIAGNOSIS — M549 Dorsalgia, unspecified: Secondary | ICD-10-CM | POA: Insufficient documentation

## 2023-08-30 DIAGNOSIS — Z5321 Procedure and treatment not carried out due to patient leaving prior to being seen by health care provider: Secondary | ICD-10-CM | POA: Insufficient documentation

## 2023-08-30 NOTE — ED Triage Notes (Signed)
Back pain radiating down left leg.  LWBS d/t wait earlier in the week and went to urgent care given a pain patch and muscle relaxers with no relief.

## 2023-08-31 NOTE — ED Notes (Signed)
X-ray come for her no response

## 2023-08-31 NOTE — ED Notes (Signed)
Pt came up to me requesting a back xray due to her back hurting, triage nurse candace informed and Xray ordered. Went back out into lobby to inform pt, but she did not respond when called.

## 2023-12-31 ENCOUNTER — Ambulatory Visit: Admitting: Physician Assistant

## 2024-01-11 ENCOUNTER — Encounter: Payer: Self-pay | Admitting: Obstetrics and Gynecology

## 2024-01-11 ENCOUNTER — Other Ambulatory Visit (HOSPITAL_COMMUNITY)
Admission: RE | Admit: 2024-01-11 | Discharge: 2024-01-11 | Disposition: A | Discharge status: Home / Self Care | Source: Ambulatory Visit | Attending: Obstetrics and Gynecology | Admitting: Obstetrics and Gynecology

## 2024-01-11 ENCOUNTER — Ambulatory Visit: Admitting: Obstetrics and Gynecology

## 2024-01-11 VITALS — BP 186/104 | HR 104 | Ht 60.0 in | Wt 223.6 lb

## 2024-01-11 DIAGNOSIS — R030 Elevated blood-pressure reading, without diagnosis of hypertension: Secondary | ICD-10-CM

## 2024-01-11 DIAGNOSIS — Z01419 Encounter for gynecological examination (general) (routine) without abnormal findings: Secondary | ICD-10-CM

## 2024-01-11 NOTE — Progress Notes (Signed)
 Pt presents for annual exam. Pt requesting STD testing; blood and swab. No symptoms. Elevated BP. Pt would like a referral to family medicine. No other concerns.

## 2024-01-11 NOTE — Patient Instructions (Signed)
 It was nice meeting you today! You will see your results in the MyChart app within 1 week. I will schedule your LEEP procedure in the operating room once we have the pap test results back.

## 2024-01-11 NOTE — Progress Notes (Signed)
 ANNUAL EXAM Patient name: Jennifer Cohen MRN [id number]  Date of birth: [birth year] Chief Complaint:   Routine Prenatal Visit  History of Present Illness:   Jennifer Cohen is a 33 y.o. [id number] with No LMP recorded. Patient has had an implant. being seen today for a routine annual exam.  Current complaints: none   Upstream - 01/11/24 1644       Pregnancy Intention Screening   Does the patient want to become pregnant in the next year? No    Does the patient's partner want to become pregnant in the next year? No    Would the patient like to discuss contraceptive options today? No      Contraception Wrap Up   Current Method Hormonal Implant    End Method Hormonal Implant    Contraception Counseling Provided No    How was the end contraceptive method provided? N/A            The pregnancy intention screening data noted above was reviewed. Potential methods of contraception were discussed. The patient elected to proceed with Hormonal Implant.   Last pap 07/08/22. 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  Last mammogram: n/a. Results were: N/A. Family h/o breast cancer: no Last colonoscopy: n/a. Results were: N/A. Family h/o colorectal cancer: no HPV vaccine: unsure     07/08/2022    3:03 PM 10/30/2021    1:17 PM 07/08/2021    9:43 AM 04/03/2021   10:02 AM  Depression screen PHQ 2/9  Decreased Interest 0 0 1 0  Down, Depressed, Hopeless 0 3 1 0  PHQ - 2 Score 0 3 2 0  Altered sleeping 0 0 1 1  Tired, decreased energy 0 3 2 3   Change in appetite 1 3 1 2   Feeling bad or failure about yourself  0 0 1 0  Trouble concentrating 0 0 1 0  Moving slowly or fidgety/restless 0 0 0 0  Suicidal thoughts 0 0 0 0  PHQ-9 Score 1 9 8 6   Difficult doing work/chores Not difficult at all  Not difficult at all         10/30/2021    1:19 PM 07/08/2021    9:44 AM 04/03/2021   10:05 AM  GAD 7 : Generalized Anxiety Score   Nervous, Anxious, on Edge 0 0 0  Control/stop worrying 1 1 3   Worry too much - different things 3 1 3   Trouble relaxing 0 1 1  Restless 0 1 0  Easily annoyed or irritable 2 1 2   Afraid - awful might happen 0 0 0  Total GAD 7 Score 6 5 9   Anxiety Difficulty  Somewhat difficult Somewhat difficult     Review of Systems:   Pertinent items are noted in HPI Denies any headaches, blurred vision, fatigue, shortness of breath, chest pain, abdominal pain, abnormal vaginal discharge/itching/odor/irritation, problems with periods, bowel movements, urination, or intercourse unless otherwise stated above. Pertinent History Reviewed:  Reviewed past medical,surgical, social and family history.  Reviewed problem list, medications and allergies. Physical Assessment:   Vitals:   01/11/24 1541 01/11/24 1549  BP: (!) 192/112 (!) 186/104  Pulse: 92 (!) 104  Weight: 223 lb 9.6 oz (101.4 kg)   Height: 5' (1.524 m)   Body mass index is 43.67 kg/m.        Physical Examination:   General appearance - well appearing, and in no distress  Mental status - alert,  oriented to person, place, and time  Chest - respiratory effort normal  Heart - normal peripheral perfusion  Breasts - declined  Abdomen - soft, nontender, nondistended, no masses or organomegaly  Pelvic - VULVA: normal appearing vulva with no masses, tenderness or lesions  VAGINA: normal appearing vagina with normal color and discharge, no lesions  CERVIX: overall normal appearance of cervix, possible lesion at 4 o'clock  Thin prep pap is done with HR HPV cotesting  UTERUS: uterus is felt to be normal size, shape, consistency and nontender   ADNEXA: No adnexal masses or tenderness noted.  Chaperone present for exam  No results found for this or any previous visit (from the past 24 hours).  Assessment & Plan:  1) Well-Woman Exam Mammogram: @ 34yo, or sooner if problems Colonoscopy: @ 34yo, or sooner if problems Pap: Collected - SCHEDULE  LEEP; prefers OR procedure Gardasil: Recommended, left prior to administration GC/CT: Collected HIV/HCV: Ordered PCP referral placed for HTN  Labs/procedures today:   Orders Placed This Encounter  Procedures   Hepatitis B surface antigen   Hepatitis C antibody   RPR   HIV Antibody (routine testing w rflx)   Ambulatory referral to Family Practice   Meds: No orders of the defined types were placed in this encounter.  Follow-up: Pending pap result  Izell Marsh, MD 01/11/2024 4:47 PM

## 2024-01-12 ENCOUNTER — Ambulatory Visit: Payer: Self-pay | Admitting: Obstetrics and Gynecology

## 2024-01-12 DIAGNOSIS — D069 Carcinoma in situ of cervix, unspecified: Secondary | ICD-10-CM

## 2024-01-12 LAB — RPR: RPR Ser Ql: NONREACTIVE

## 2024-01-12 LAB — HIV ANTIBODY (ROUTINE TESTING W REFLEX): HIV Screen 4th Generation wRfx: NONREACTIVE

## 2024-01-12 LAB — HEPATITIS C ANTIBODY: Hep C Virus Ab: NONREACTIVE

## 2024-01-12 LAB — HEPATITIS B SURFACE ANTIGEN: Hepatitis B Surface Ag: NEGATIVE

## 2024-01-13 LAB — CERVICOVAGINAL ANCILLARY ONLY
Chlamydia: NEGATIVE
Comment: NEGATIVE
Comment: NEGATIVE
Comment: NORMAL
Neisseria Gonorrhea: NEGATIVE
Trichomonas: NEGATIVE

## 2024-01-14 LAB — CYTOLOGY - PAP
Comment: NEGATIVE
Comment: NEGATIVE
Comment: NEGATIVE
Diagnosis: HIGH — AB
HPV 16: POSITIVE — AB
HPV 18 / 45: NEGATIVE
High risk HPV: POSITIVE — AB

## 2024-01-18 ENCOUNTER — Telehealth: Payer: Self-pay

## 2024-01-18 NOTE — Telephone Encounter (Signed)
 I called patient to schedule surgery w/ Dr. Donetta Furl on 01/26/24 at 3 pm @[facility]. Patient confirmed her availability and asked to be scheduled. I provided pre-op instructions and surgery details over the phone. Written details will be sent to patient's Mychart.

## 2024-01-19 ENCOUNTER — Encounter

## 2024-01-20 ENCOUNTER — Encounter (HOSPITAL_COMMUNITY): Payer: Self-pay | Admitting: Obstetrics and Gynecology

## 2024-01-20 NOTE — Progress Notes (Signed)
 Spoke w/ via phone for pre-op interview--- Jennifer Cohen Lab needs dos---- UPT per surgeon. EKG and BMP per anesthesia.        Lab results------ COVID test -----patient states asymptomatic no test needed Arrive at -------1330 NPO after MN NO Solid Food.  Clear liquids from MN until---1230 Pre-Surgery Ensure or G2:  Med rec completed Medications to take morning of surgery -----NONE Diabetic medication -----  GLP1 agonist last dose: GLP1 instructions:  Patient instructed no nail polish to be worn day of surgery Patient instructed to bring photo id and insurance card day of surgery Patient aware to have Driver (ride ) / caregiver    for 24 hours after surgery - Friend Jennifer Cohen or [facility] Patient Special Instructions ----- Shower with antibacterial soap. Pre-Op special Instructions -----  Patient verbalized understanding of instructions that were given at this phone interview. Patient denies chest pain, sob, fever, cough at the interview.

## 2024-01-21 ENCOUNTER — Ambulatory Visit

## 2024-01-21 VITALS — BP 144/95 | HR 96 | Wt 223.7 lb

## 2024-01-21 DIAGNOSIS — Z0130 Encounter for examination of blood pressure without abnormal findings: Secondary | ICD-10-CM

## 2024-01-21 NOTE — Progress Notes (Signed)
..  Subjective:  Jennifer Cohen is a 33 y.o. female here for BP check.   Hypertension ROS: no TIA's, no chest pain on exertion, no dyspnea on exertion, and no swelling of ankles.    Objective:  1st BP: 151/105, pulse 99 BP (!) 144/95   Pulse 96   Wt 223 lb 11.2 oz (101.5 kg)   LMP  (LMP Unknown)   BMI 43.69 kg/m   Appearance alert, well appearing, and in no distress. General exam BP noted to be well controlled today in office.    Assessment:   Blood Pressure poorly controlled.   Plan:  Per appt notes, forwarded chart to Dr. Donetta Furl for evaluation due to pt's upcoming procedure on 01/26/24. Advised pt that we will follow up with next steps. Advised of abnormal symptoms and to go to the hospital if they occur, pt agreed.

## 2024-01-26 ENCOUNTER — Encounter (HOSPITAL_COMMUNITY)
Admission: RE | Disposition: A | Discharge status: Home / Self Care | Payer: Self-pay | Source: Home / Self Care | Attending: Obstetrics and Gynecology

## 2024-01-26 ENCOUNTER — Other Ambulatory Visit: Payer: Self-pay

## 2024-01-26 ENCOUNTER — Ambulatory Visit (HOSPITAL_COMMUNITY): Admitting: Registered Nurse

## 2024-01-26 ENCOUNTER — Encounter (HOSPITAL_COMMUNITY): Payer: Self-pay | Admitting: Obstetrics and Gynecology

## 2024-01-26 ENCOUNTER — Ambulatory Visit (HOSPITAL_COMMUNITY)
Admission: RE | Admit: 2024-01-26 | Discharge: 2024-01-26 | Disposition: A | Discharge status: Home / Self Care | Attending: Obstetrics and Gynecology | Admitting: Obstetrics and Gynecology

## 2024-01-26 DIAGNOSIS — I10 Essential (primary) hypertension: Secondary | ICD-10-CM | POA: Insufficient documentation

## 2024-01-26 DIAGNOSIS — D060 Carcinoma in situ of endocervix: Secondary | ICD-10-CM | POA: Diagnosis not present

## 2024-01-26 DIAGNOSIS — D069 Carcinoma in situ of cervix, unspecified: Secondary | ICD-10-CM

## 2024-01-26 DIAGNOSIS — F17210 Nicotine dependence, cigarettes, uncomplicated: Secondary | ICD-10-CM

## 2024-01-26 DIAGNOSIS — F32A Depression, unspecified: Secondary | ICD-10-CM | POA: Diagnosis not present

## 2024-01-26 DIAGNOSIS — Z6841 Body Mass Index (BMI) 40.0 and over, adult: Secondary | ICD-10-CM | POA: Diagnosis not present

## 2024-01-26 DIAGNOSIS — E119 Type 2 diabetes mellitus without complications: Secondary | ICD-10-CM | POA: Insufficient documentation

## 2024-01-26 DIAGNOSIS — E66813 Obesity, class 3: Secondary | ICD-10-CM | POA: Insufficient documentation

## 2024-01-26 DIAGNOSIS — I159 Secondary hypertension, unspecified: Secondary | ICD-10-CM

## 2024-01-26 HISTORY — PX: LEEP: SHX91

## 2024-01-26 LAB — BASIC METABOLIC PANEL WITH GFR
Anion gap: 13 (ref 5–15)
BUN: 8 mg/dL (ref 6–20)
CO2: 24 mmol/L (ref 22–32)
Calcium: 9.2 mg/dL (ref 8.9–10.3)
Chloride: 105 mmol/L (ref 98–111)
Creatinine, Ser: 0.64 mg/dL (ref 0.44–1.00)
GFR, Estimated: >60 mL/min (ref >60)
Glucose, Bld: 98 mg/dL (ref 70–99)
Potassium: 3.4 mmol/L — ABNORMAL LOW (ref 3.5–5.1)
Sodium: 142 mmol/L (ref 135–145)

## 2024-01-26 LAB — POCT PREGNANCY, URINE: Preg Test, Ur: NEGATIVE

## 2024-01-26 SURGERY — LEEP (LOOP ELECTROSURGICAL EXCISION PROCEDURE)
Anesthesia: General

## 2024-01-26 MED ORDER — ONDANSETRON HCL 4 MG/2ML IJ SOLN
INTRAMUSCULAR | Status: DC | PRN
  Administered 2024-01-26: 4 mg via INTRAVENOUS

## 2024-01-26 MED ORDER — LIDOCAINE HCL 1 % IJ SOLN
INTRAMUSCULAR | Status: AC
  Filled 2024-01-26: qty 20

## 2024-01-26 MED ORDER — MIDAZOLAM HCL 2 MG/2ML IJ SOLN
INTRAMUSCULAR | Status: DC | PRN
  Administered 2024-01-26: 2 mg via INTRAVENOUS

## 2024-01-26 MED ORDER — CHLORHEXIDINE GLUCONATE 0.12 % MT SOLN
OROMUCOSAL | Status: AC
  Administered 2024-01-26: 15 mL via OROMUCOSAL
  Filled 2024-01-26: qty 15

## 2024-01-26 MED ORDER — ONDANSETRON HCL 4 MG/2ML IJ SOLN: 4.0000 mg | Freq: Once | INTRAMUSCULAR | Status: DC | PRN

## 2024-01-26 MED ORDER — LABETALOL HCL 5 MG/ML IV SOLN
5.0000 mg | Freq: Once | INTRAVENOUS | Status: AC
  Administered 2024-01-26: 5 mg via INTRAVENOUS

## 2024-01-26 MED ORDER — IBUPROFEN 600 MG PO TABS: 600.0000 mg | ORAL_TABLET | Freq: Four times a day (QID) | ORAL | 0 refills | Status: AC | PRN

## 2024-01-26 MED ORDER — OXYCODONE HCL 5 MG PO TABS
ORAL_TABLET | ORAL | Status: AC
  Filled 2024-01-26: qty 1

## 2024-01-26 MED ORDER — IODINE STRONG (LUGOLS) 5 % PO SOLN
ORAL | Status: AC
  Filled 2024-01-26: qty 1

## 2024-01-26 MED ORDER — KETOROLAC TROMETHAMINE 30 MG/ML IJ SOLN
INTRAMUSCULAR | Status: DC | PRN
  Administered 2024-01-26: 30 mg via INTRAVENOUS

## 2024-01-26 MED ORDER — LACTATED RINGERS IV SOLN: INTRAVENOUS | Status: DC

## 2024-01-26 MED ORDER — LIDOCAINE 2% (20 MG/ML) 5 ML SYRINGE
INTRAMUSCULAR | Status: AC
  Filled 2024-01-26: qty 5

## 2024-01-26 MED ORDER — MONSELS FERRIC SUBSULFATE EX SOLN
CUTANEOUS | Status: DC | PRN
  Administered 2024-01-26: 1 via TOPICAL

## 2024-01-26 MED ORDER — ORAL CARE MOUTH RINSE: 15.0000 mL | Freq: Once | OROMUCOSAL | Status: AC

## 2024-01-26 MED ORDER — DEXAMETHASONE SODIUM PHOSPHATE 10 MG/ML IJ SOLN
INTRAMUSCULAR | Status: DC | PRN
  Administered 2024-01-26: 10 mg via INTRAVENOUS

## 2024-01-26 MED ORDER — CHLORHEXIDINE GLUCONATE 0.12 % MT SOLN: 15.0000 mL | Freq: Once | OROMUCOSAL | Status: AC

## 2024-01-26 MED ORDER — ACETIC ACID 4% SOLUTION
Status: DC | PRN
  Administered 2024-01-26: 1 via TOPICAL

## 2024-01-26 MED ORDER — LABETALOL HCL 5 MG/ML IV SOLN
INTRAVENOUS | Status: AC
  Administered 2024-01-26: 5 mg via INTRAVENOUS
  Filled 2024-01-26: qty 4

## 2024-01-26 MED ORDER — MONSELS FERRIC SUBSULFATE EX SOLN
CUTANEOUS | Status: AC
  Filled 2024-01-26: qty 8

## 2024-01-26 MED ORDER — FENTANYL CITRATE (PF) 250 MCG/5ML IJ SOLN
INTRAMUSCULAR | Status: AC
Start: 2024-01-26 — End: ?
  Filled 2024-01-26: qty 5

## 2024-01-26 MED ORDER — DEXAMETHASONE SODIUM PHOSPHATE 10 MG/ML IJ SOLN
INTRAMUSCULAR | Status: AC
  Filled 2024-01-26: qty 1

## 2024-01-26 MED ORDER — PROPOFOL 10 MG/ML IV BOLUS
INTRAVENOUS | Status: DC | PRN
  Administered 2024-01-26: 100 mg via INTRAVENOUS
  Administered 2024-01-26: 200 mg via INTRAVENOUS

## 2024-01-26 MED ORDER — FENTANYL CITRATE (PF) 100 MCG/2ML IJ SOLN: 25.0000 ug | INTRAMUSCULAR | Status: DC | PRN

## 2024-01-26 MED ORDER — PHENYLEPHRINE 80 MCG/ML (10ML) SYRINGE FOR IV PUSH (FOR BLOOD PRESSURE SUPPORT)
PREFILLED_SYRINGE | INTRAVENOUS | Status: DC | PRN
  Administered 2024-01-26 (×4): 80 ug via INTRAVENOUS

## 2024-01-26 MED ORDER — ACETIC ACID 5 % SOLN
Status: AC
Start: 2024-01-26 — End: ?
  Filled 2024-01-26: qty 12

## 2024-01-26 MED ORDER — ONDANSETRON HCL 4 MG/2ML IJ SOLN
INTRAMUSCULAR | Status: AC
  Filled 2024-01-26: qty 2

## 2024-01-26 MED ORDER — OXYCODONE HCL 5 MG/5ML PO SOLN: 5.0000 mg | Freq: Once | ORAL | Status: AC | PRN

## 2024-01-26 MED ORDER — KETOROLAC TROMETHAMINE 30 MG/ML IJ SOLN
INTRAMUSCULAR | Status: AC
  Filled 2024-01-26: qty 1

## 2024-01-26 MED ORDER — LIDOCAINE 2% (20 MG/ML) 5 ML SYRINGE
INTRAMUSCULAR | Status: DC | PRN
  Administered 2024-01-26: 100 mg via INTRAVENOUS

## 2024-01-26 MED ORDER — FENTANYL CITRATE (PF) 250 MCG/5ML IJ SOLN
INTRAMUSCULAR | Status: DC | PRN
  Administered 2024-01-26 (×2): 50 ug via INTRAVENOUS

## 2024-01-26 MED ORDER — ACETIC ACID 5 % SOLN
Status: AC
  Filled 2024-01-26: qty 12

## 2024-01-26 MED ORDER — OXYCODONE HCL 5 MG PO TABS
5.0000 mg | ORAL_TABLET | Freq: Once | ORAL | Status: AC | PRN
  Administered 2024-01-26: 5 mg via ORAL

## 2024-01-26 MED ORDER — LABETALOL HCL 5 MG/ML IV SOLN: 5.0000 mg | Freq: Once | INTRAVENOUS | Status: AC

## 2024-01-26 MED ORDER — MIDAZOLAM HCL 2 MG/2ML IJ SOLN
INTRAMUSCULAR | Status: AC
Start: 2024-01-26 — End: ?
  Filled 2024-01-26: qty 2

## 2024-01-26 MED ORDER — LIDOCAINE-EPINEPHRINE 1 %-1:100000 IJ SOLN
INTRAMUSCULAR | Status: DC | PRN
  Administered 2024-01-26: 10 mL

## 2024-01-26 SURGICAL SUPPLY — 18 items
BLADE SURG 11 STRL SS (BLADE) ×1 IMPLANT
COVER MAYO STAND STRL (DRAPES) ×1 IMPLANT
ELECT BALL LEEP 3MM BLK (ELECTRODE) IMPLANT
ELECT BALL LEEP 5MM RED (ELECTRODE) IMPLANT
ELECTRODE LOOP LP RND 10X10YLW (CUTTING LOOP) IMPLANT
ELECTRODE LOOP LP RND 15X12GRN (CUTTING LOOP) IMPLANT
ELECTRODE LOOP LP RND 20X12WHT (CUTTING LOOP) IMPLANT
ELECTRODE LOOP LP SQR 10X10ORG (CUTTING LOOP) IMPLANT
EXTENDER ELECT LOOP LEEP 10CM (CUTTING LOOP) IMPLANT
GLOVE BIO SURGEON STRL SZ7 (GLOVE) ×1 IMPLANT
GLOVE BIOGEL PI IND STRL 7.0 (GLOVE) ×1 IMPLANT
GOWN STRL REUS W/ TWL LRG LVL3 (GOWN DISPOSABLE) ×1 IMPLANT
PACK VAGINAL WOMENS (CUSTOM PROCEDURE TRAY) ×1 IMPLANT
PAD OB MATERNITY 11 LF (PERSONAL CARE ITEMS) ×1 IMPLANT
SCOPETTES 8 STERILE (MISCELLANEOUS) ×1 IMPLANT
SLEEVE SCD COMPRESS KNEE MED (STOCKING) ×1 IMPLANT
SUT VIC AB 0 CT1 27XBRD ANBCTR (SUTURE) IMPLANT
TOWEL GREEN STERILE (TOWEL DISPOSABLE) ×1 IMPLANT

## 2024-01-26 NOTE — Anesthesia Preprocedure Evaluation (Addendum)
 Anesthesia Evaluation  Patient identified by MRN, date of birth, ID band Patient awake    Reviewed: Allergy & Precautions, NPO status , Patient's Chart, lab work & pertinent test results  Airway Mallampati: III  TM Distance: >3 FB     Dental no notable dental hx. (+) Teeth Intact, Dental Advisory Given   Pulmonary Current SmokerPatient did not abstain from smoking.   Pulmonary exam normal breath sounds clear to auscultation       Cardiovascular hypertension, Normal cardiovascular exam Rhythm:Regular Rate:Normal     Neuro/Psych  PSYCHIATRIC DISORDERS  Depression    negative neurological ROS     GI/Hepatic negative GI ROS, Neg liver ROS,,,  Endo/Other  diabetes, Gestational  Class 3 obesity  Renal/GU negative Renal ROS  negative genitourinary   Musculoskeletal negative musculoskeletal ROS (+)    Abdominal  (+) + obese  Peds  Hematology negative hematology ROS (+)   Anesthesia Other Findings   Reproductive/Obstetrics CIN 3                             Anesthesia Physical Anesthesia Plan  ASA: 3  Anesthesia Plan: General   Post-op Pain Management:    Induction:   PONV Risk Score and Plan: 3 and Treatment may vary due to age or medical condition, Midazolam, Ondansetron  and Dexamethasone  Airway Management Planned: LMA  Additional Equipment: None  Intra-op Plan:   Post-operative Plan: Extubation in OR  Informed Consent: I have reviewed the patients History and Physical, chart, labs and discussed the procedure including the risks, benefits and alternatives for the proposed anesthesia with the patient or authorized representative who has indicated his/her understanding and acceptance.     Dental advisory given  Plan Discussed with: CRNA and Anesthesiologist  Anesthesia Plan Comments:         Anesthesia Quick Evaluation

## 2024-01-26 NOTE — Op Note (Signed)
 Preop: Cervical dysplasia  Postop: Same Op: LEEP Surgeon: Dr. Loralyn Rochester Assist: None Anesthesia: LMA  EBL 5 cc IVF: Per anesthesia documentation  UOP: Not collected Findings: Normal cervix consistency without mass. Thick AWE with coarse mosaicism and punctation most notable from 4-8 o'clock, atypical vessel noted at 6 o'clock. Predict CIN3+  Specimen: Ectocervix, Endocervix tagged on the superficial side.   Description of the procedure: Preop antibiotics not indicated. Informed consent reviewed and signed. Pt given opportunity to ask questions.   Pt prepped and draped in the normal dorsal lithotomy fashion after anesthesia found to be adequate. Timeout performed.  Vented speculum placed into the vagina. Cervix grasped with a single tooth tenaculum. Intracervical block done with 1% xylocaine  and 1:200,000 units of epinephrine - 10 cc total used. First pass done with 20 x 10 loop. Top hat then done with a 10x10 loop. Cervical edge cauterized with the ball. Monsel's applied. Procedure completed. All instruments removed.   Pt taken to recovery room in stable condition.  Loralyn Rochester, MD Obstetrician & Gynecologist, [facility] for [employer], [facility]

## 2024-01-26 NOTE — Discharge Instructions (Signed)
 Post-surgical Instructions, Outpatient Surgery  You may expect to feel dizzy, weak, and drowsy for as long as 24 hours after receiving the medicine that made you sleep (anesthetic). For the first 24 hours after your surgery:   Do not drive a car, ride a bicycle, participate in physical activities, or take public transportation  Do not drink alcohol or take tranquilizers.  Do not take medicine that has not been prescribed by your physicians.  Do not sign important papers or make important decisions while on narcotic pain medicines.  Have a responsible person with you.   PAIN MANAGEMENT Ibuprofen  600mg .  (This is the same as 3-200mg  over the counter tablets of Motrin  or ibuprofen .)  Take this every 6 hours or as needed for cramping.   Acetaminophen  1000mg  (This is the same as 2-500mg  over the counter extra strength tylenol ). Take this every 6 hours for the first 3 days or as needed afterwards for pain  DO'S AND DON'T'S Do not take a tub bath for 2 weeks.  You may shower on the first day after your surgery Do move around as you feel able.  Stairs are fine.  You may begin to exercise again as you feel able Do not put anything in the vagina for two weeks--no tampons, intercourse, or douching.    REGULAR MEDIATIONS/VITAMINS: You may restart all of your regular medications as prescribed. You may restart all of your vitamins as you normally take them.    PLEASE CALL OR SEEK MEDICAL CARE IF: You have persistent nausea and vomiting.  You have trouble eating or drinking.  You have an oral temperature above 100.5.  You have heavy vaginal bleeding
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
## 2024-01-26 NOTE — Anesthesia Procedure Notes (Signed)
 Procedure Name: LMA Insertion Date/Time: 01/26/2024 4:09 PM  Performed by: Jamas Maywood, CRNAPre-anesthesia Checklist: Patient identified, Emergency Drugs available, Suction available and Patient being monitored Patient Re-evaluated:Patient Re-evaluated prior to induction Oxygen Delivery Method: Circle system utilized Preoxygenation: Pre-oxygenation with 100% oxygen Induction Type: IV induction Ventilation: Mask ventilation without difficulty LMA: LMA inserted LMA Size: 4.0 Number of attempts: 1 Placement Confirmation: positive ETCO2 and breath sounds checked- equal and bilateral Tube secured with: Tape Dental Injury: Teeth and Oropharynx as per pre-operative assessment

## 2024-01-26 NOTE — Transfer of Care (Signed)
 Immediate Anesthesia Transfer of Care Note  Patient: Jennifer Cohen  Procedure(s) Performed: LEEP (LOOP ELECTROSURGICAL EXCISION PROCEDURE)  Patient Location: PACU  Anesthesia Type:General  Level of Consciousness: awake, oriented, drowsy, patient cooperative, and responds to stimulation  Airway & Oxygen Therapy: Patient Spontanous Breathing and Patient connected to face mask oxygen  Post-op Assessment: Report given to RN, Post -op Vital signs reviewed and stable, Patient moving all extremities X 4, and Patient able to stick tongue midline  Post vital signs: Reviewed and stable  Last Vitals:  Vitals Value Taken Time  BP 139/91 01/26/24 1652  Temp 36.8 C 01/26/24 1652  Pulse 81 01/26/24 1654  Resp 21 01/26/24 1654  SpO2 100 % 01/26/24 1654  Vitals shown include unfiled device data.  Last Pain:  Vitals:   01/26/24 1407  TempSrc: Oral         Complications: There were no known notable events for this encounter.

## 2024-01-27 ENCOUNTER — Encounter (HOSPITAL_COMMUNITY): Payer: Self-pay | Admitting: Obstetrics and Gynecology

## 2024-01-27 NOTE — Anesthesia Postprocedure Evaluation (Signed)
 Anesthesia Post Note  Patient: Jennifer Cohen  Procedure(s) Performed: LEEP (LOOP ELECTROSURGICAL EXCISION PROCEDURE)     Patient location during evaluation: PACU Anesthesia Type: General Level of consciousness: awake and alert Pain management: pain level controlled Vital Signs Assessment: post-procedure vital signs reviewed and stable Respiratory status: spontaneous breathing, nonlabored ventilation, respiratory function stable and patient connected to nasal cannula oxygen Cardiovascular status: blood pressure returned to baseline and stable Postop Assessment: no apparent nausea or vomiting Anesthetic complications: no   There were no known notable events for this encounter.  Last Vitals:  Vitals:   01/26/24 1715 01/26/24 1730  BP: (!) 166/108 (!) 164/105  Pulse: 83 86  Resp: (!) 21 (!) 21  Temp:  36.8 C  SpO2: 98% 98%    Last Pain:  Vitals:   01/26/24 1730  TempSrc:   PainSc: 3                  Willian Harrow

## 2024-01-28 ENCOUNTER — Ambulatory Visit: Payer: Self-pay | Admitting: Obstetrics and Gynecology

## 2024-01-28 LAB — SURGICAL PATHOLOGY

## 2024-02-25 ENCOUNTER — Encounter: Admitting: Obstetrics and Gynecology

## 2024-03-07 ENCOUNTER — Encounter: Admitting: Obstetrics and Gynecology

## 2024-03-28 ENCOUNTER — Encounter: Admitting: Obstetrics and Gynecology

## 2024-04-11 ENCOUNTER — Encounter: Admitting: Obstetrics and Gynecology

## 2024-05-08 ENCOUNTER — Emergency Department (HOSPITAL_COMMUNITY)
Admission: EM | Admit: 2024-05-08 | Discharge: 2024-05-08 | Disposition: A | Discharge status: Home / Self Care | Attending: Emergency Medicine | Admitting: Emergency Medicine

## 2024-05-08 ENCOUNTER — Encounter (HOSPITAL_COMMUNITY): Payer: Self-pay | Admitting: *Deleted

## 2024-05-08 ENCOUNTER — Other Ambulatory Visit: Payer: Self-pay

## 2024-05-08 DIAGNOSIS — I10 Essential (primary) hypertension: Secondary | ICD-10-CM | POA: Insufficient documentation

## 2024-05-08 DIAGNOSIS — Z202 Contact with and (suspected) exposure to infections with a predominantly sexual mode of transmission: Secondary | ICD-10-CM | POA: Diagnosis present

## 2024-05-08 DIAGNOSIS — N760 Acute vaginitis: Secondary | ICD-10-CM | POA: Diagnosis not present

## 2024-05-08 DIAGNOSIS — B9689 Other specified bacterial agents as the cause of diseases classified elsewhere: Secondary | ICD-10-CM

## 2024-05-08 LAB — WET PREP, GENITAL
Sperm: NONE SEEN
Trich, Wet Prep: NONE SEEN
WBC, Wet Prep HPF POC: <10 (ref <10)
Yeast Wet Prep HPF POC: NONE SEEN

## 2024-05-08 LAB — RPR: RPR Ser Ql: NONREACTIVE

## 2024-05-08 LAB — HIV ANTIBODY (ROUTINE TESTING W REFLEX): HIV Screen 4th Generation wRfx: NONREACTIVE

## 2024-05-08 MED ORDER — METRONIDAZOLE 500 MG PO TABS: 500.0000 mg | ORAL_TABLET | Freq: Two times a day (BID) | ORAL | 0 refills | Status: AC

## 2024-05-08 NOTE — ED Triage Notes (Signed)
 Patient wants to be tested for HIV/ STDs.

## 2024-05-08 NOTE — ED Notes (Signed)
 Pt provided with swabs & given instructions on how to complete self collection.

## 2024-05-08 NOTE — ED Triage Notes (Signed)
 The pt reports that she has no symptoms but she wants to be tested for all stds.  She woke up this am and decided she wanted the tests done

## 2024-05-08 NOTE — ED Provider Notes (Signed)
 [place] EMERGENCY DEPARTMENT AT [facility] Provider Note   CSN: [id number] Arrival date & time: 05/08/24  0235     Patient presents with: Exposure to STD   Jennifer Cohen is a 33 y.o. female.  Patient with past medical history significant for hypertension, trichomoniasis, gonorrhea presents to the emergency department complaining of concerns of possible exposure to STI.  She denies any symptoms at this time.  She denies vaginal discharge, dysuria, fever, nausea, vomiting.  Patient is requesting testing for gonorrhea, chlamydia, HIV    Exposure to STD       Prior to Admission medications   Medication Sig Start Date End Date Taking? Authorizing Provider  metroNIDAZOLE  (FLAGYL ) 500 MG tablet Take 1 tablet (500 mg total) by mouth 2 (two) times daily. 05/08/24  Yes Logan Ubaldo NOVAK, PA-C  ibuprofen  (ADVIL ) 600 MG tablet Take 1 tablet (600 mg total) by mouth every 6 (six) hours as needed. 01/26/24   Erik Kieth BROCKS, MD    Allergies: Patient has no known allergies.    Review of Systems  Updated Vital Signs BP (!) 174/109 (BP Location: Right Arm)   Pulse 100   Temp 98.2 F (36.8 C)   Resp 15   Ht 5' (1.524 m)   Wt 101.2 kg   LMP 05/08/2024   SpO2 94%   BMI 43.57 kg/m   Physical Exam Vitals and nursing note reviewed.  HENT:     Head: Normocephalic and atraumatic.  Pulmonary:     Effort: Pulmonary effort is normal. No respiratory distress.  Musculoskeletal:        General: No signs of injury.     Cervical back: Normal range of motion.  Skin:    General: Skin is dry.  Neurological:     Mental Status: She is alert.  Psychiatric:        Speech: Speech normal.        Behavior: Behavior normal.     (all labs ordered are listed, but only abnormal results are displayed) Labs Reviewed  WET PREP, GENITAL - Abnormal; Notable for the following components:      Result Value   Clue Cells Wet Prep HPF POC PRESENT (*)    All other components within normal  limits  HIV ANTIBODY (ROUTINE TESTING W REFLEX)  RPR  GC/CHLAMYDIA PROBE AMP ([facility]) NOT AT [facility]    EKG: None  Radiology: No results found.   Procedures   Medications Ordered in the ED - No data to display                                  Medical Decision Making Amount and/or Complexity of Data Reviewed Labs: ordered.   Patient presents to the emergency permit requesting STI testing.  She is asymptomatic at this time.  I ordered and interpreted lab results.  Pertinent results include wet prep showing clue cells.  GC chlamydia probe, RPR, and HIV testing pending.  Patient prescribed metronidazole  for BV.  Other testing pending.  Patient to follow MyChart for results.  She has been advised on aftercare instructions and also advised that partners will need treatment if any STI test returned as positive.  Patient stable for discharge home at this time.  Return precautions provided.     Final diagnoses:  Possible exposure to STD  BV (bacterial vaginosis)    ED Discharge Orders  Ordered    metroNIDAZOLE  (FLAGYL ) 500 MG tablet  2 times daily        05/08/24 0442               Logan Ubaldo NOVAK, PA-C 05/08/24 0443    Trine Raynell Moder, MD 05/08/24 [phone]

## 2024-05-08 NOTE — Discharge Instructions (Signed)
 BV Aftercare Instructions     **Diagnosis and Treatment**      - The patient has been diagnosed with symptomatic bacterial vaginosis and is being treated with oral metronidazole  500 mg twice daily for 7 days, which is a first-line regimen recommended by the CDC.      **Medication Adherence**      - Complete the full 7-day course of metronidazole , even if symptoms resolve before finishing the medication.      - Common side effects include nausea, a metallic taste, and gastrointestinal upset. Advise the patient to take the medication with food to minimize GI symptoms.    - There is no convincing evidence of a disulfiram-like reaction with alcohol; abstaining from alcohol during metronidazole  therapy is not required.      **Sexual Activity and Prevention of Reinfection**      - Advise abstinence from sexual activity or consistent and correct condom use during the treatment course to reduce the risk of reinfection and recurrence.    - Douching should be avoided, as it increases the risk of BV recurrence and offers no therapeutic benefit.    - Partners do not require treatment for BV, but partner evaluation and treatment are essential if concurrent STIs are identified.  Follow MyChart for results of your other STI testing including, HIV, syphilis, gonorrhea and chlamydia.  If positive you will need to reach out for further treatment and your partners will also need to be treated.

## 2024-05-09 LAB — GC/CHLAMYDIA PROBE AMP (~~LOC~~) NOT AT ARMC
Chlamydia: NEGATIVE
Comment: NEGATIVE
Comment: NORMAL
Neisseria Gonorrhea: NEGATIVE

## 2024-06-13 ENCOUNTER — Other Ambulatory Visit (HOSPITAL_COMMUNITY)
Admission: RE | Admit: 2024-06-13 | Discharge: 2024-06-13 | Disposition: A | Discharge status: Home / Self Care | Source: Ambulatory Visit | Attending: Family Medicine | Admitting: Family Medicine

## 2024-06-13 ENCOUNTER — Ambulatory Visit

## 2024-06-13 VITALS — BP 141/89 | HR 112

## 2024-06-13 DIAGNOSIS — Z23 Encounter for immunization: Secondary | ICD-10-CM

## 2024-06-13 DIAGNOSIS — Z113 Encounter for screening for infections with a predominantly sexual mode of transmission: Secondary | ICD-10-CM

## 2024-06-13 NOTE — Progress Notes (Signed)
 Jennifer Cohen is here for their 1st Gardasil injection. Pt denies any issues at this time. Pt tolerated injection well. To follow up in 2 months for next injection.   SUBJECTIVE:  33 y.o. female who desires a STI screen. Denies abnormal vaginal discharge, bleeding or significant pelvic pain. No UTI symptoms. Denies history of known exposure to STD.  No LMP recorded. Patient has had an implant.  OBJECTIVE:  She appears well.   ASSESSMENT:  STI Screen   PLAN:  Pt offered STI blood screening-requested GC, chlamydia, and trichomonas probe sent to lab.  Treatment: To be determined once lab results are received.  Pt follow up as needed.

## 2024-06-14 ENCOUNTER — Ambulatory Visit: Payer: Self-pay | Admitting: Family Medicine

## 2024-06-14 LAB — HEPATITIS B SURFACE ANTIGEN: Hepatitis B Surface Ag: NEGATIVE

## 2024-06-14 LAB — CERVICOVAGINAL ANCILLARY ONLY
Bacterial Vaginitis (gardnerella): NEGATIVE
Candida Glabrata: NEGATIVE
Candida Vaginitis: POSITIVE — AB
Chlamydia: NEGATIVE
Comment: NEGATIVE
Comment: NEGATIVE
Comment: NEGATIVE
Comment: NEGATIVE
Comment: NEGATIVE
Comment: NORMAL
Neisseria Gonorrhea: NEGATIVE
Trichomonas: NEGATIVE

## 2024-06-14 LAB — RPR: RPR Ser Ql: NONREACTIVE

## 2024-06-14 LAB — HIV ANTIBODY (ROUTINE TESTING W REFLEX): HIV Screen 4th Generation wRfx: NONREACTIVE

## 2024-06-14 LAB — HEPATITIS C ANTIBODY: Hep C Virus Ab: NONREACTIVE

## 2024-06-14 MED ORDER — FLUCONAZOLE 150 MG PO TABS: 150.0000 mg | ORAL_TABLET | Freq: Once | ORAL | 0 refills | Status: AC

## 2024-08-15 ENCOUNTER — Ambulatory Visit
